# Patient Record
Sex: Male | Born: 1955 | Race: Black or African American | Hispanic: No | Marital: Single | Smoking: Current some day smoker
Health system: Southern US, Community
[De-identification: ages and names within clinical notes are randomized; demographics above are authoritative.]

## PROBLEM LIST (undated history)

## (undated) DIAGNOSIS — M549 Dorsalgia, unspecified: Secondary | ICD-10-CM

## (undated) DIAGNOSIS — I10 Essential (primary) hypertension: Secondary | ICD-10-CM

## (undated) DIAGNOSIS — J189 Pneumonia, unspecified organism: Secondary | ICD-10-CM

## (undated) DIAGNOSIS — I82409 Acute embolism and thrombosis of unspecified deep veins of unspecified lower extremity: Secondary | ICD-10-CM

## (undated) DIAGNOSIS — M25569 Pain in unspecified knee: Secondary | ICD-10-CM

## (undated) HISTORY — PX: MANDIBLE SURGERY: SHX707

---

## 1999-04-17 ENCOUNTER — Emergency Department (HOSPITAL_COMMUNITY): Admission: EM | Admit: 1999-04-17 | Discharge: 1999-04-17 | Payer: Self-pay | Admitting: Emergency Medicine

## 2001-03-15 ENCOUNTER — Emergency Department (HOSPITAL_COMMUNITY): Admission: EM | Admit: 2001-03-15 | Discharge: 2001-03-15 | Payer: Self-pay | Admitting: Emergency Medicine

## 2005-07-04 ENCOUNTER — Emergency Department (HOSPITAL_COMMUNITY): Admission: EM | Admit: 2005-07-04 | Discharge: 2005-07-04 | Payer: Self-pay | Admitting: Family Medicine

## 2006-06-29 ENCOUNTER — Ambulatory Visit: Payer: Self-pay | Admitting: Family Medicine

## 2006-06-30 ENCOUNTER — Ambulatory Visit (HOSPITAL_COMMUNITY): Admission: RE | Admit: 2006-06-30 | Discharge: 2006-06-30 | Payer: Self-pay | Admitting: Family Medicine

## 2006-07-01 ENCOUNTER — Ambulatory Visit: Payer: Self-pay | Admitting: *Deleted

## 2006-07-10 ENCOUNTER — Ambulatory Visit: Payer: Self-pay | Admitting: Family Medicine

## 2007-05-14 ENCOUNTER — Ambulatory Visit: Payer: Self-pay | Admitting: Internal Medicine

## 2007-05-14 LAB — CONVERTED CEMR LAB
ALT: 18 U/L
AST: 19 U/L
Albumin: 4.4 g/dL
Alkaline Phosphatase: 96 U/L
BUN: 17 mg/dL
Basophils Absolute: 0.1 K/uL
Basophils Relative: 1 %
CO2: 25 meq/L
Calcium: 9.5 mg/dL
Chloride: 107 meq/L
Cholesterol: 180 mg/dL
Creatinine, Ser: 1.66 mg/dL — ABNORMAL HIGH
Eosinophils Absolute: 0.2 K/uL
Eosinophils Relative: 4 %
Glucose, Bld: 81 mg/dL
HCT: 49.6 %
HDL: 51 mg/dL
Hemoglobin: 16.1 g/dL
LDL Cholesterol: 79 mg/dL
Lymphocytes Relative: 36 %
Lymphs Abs: 2.1 K/uL
MCHC: 32.5 g/dL
MCV: 84.1 fL
Monocytes Absolute: 0.6 K/uL
Monocytes Relative: 10 %
Neutro Abs: 2.9 K/uL
Neutrophils Relative %: 49 %
Platelets: 210 K/uL
Potassium: 4.2 meq/L
RBC: 5.9 M/uL — ABNORMAL HIGH
RDW: 13.9 %
Sodium: 144 meq/L
Total Bilirubin: 0.4 mg/dL
Total CHOL/HDL Ratio: 3.5
Total Protein: 7 g/dL
Triglycerides: 250 mg/dL — ABNORMAL HIGH
VLDL: 50 mg/dL — ABNORMAL HIGH
WBC: 5.9 10*3/microliter

## 2007-06-02 ENCOUNTER — Encounter (INDEPENDENT_AMBULATORY_CARE_PROVIDER_SITE_OTHER): Payer: Self-pay | Admitting: *Deleted

## 2007-08-25 ENCOUNTER — Ambulatory Visit: Payer: Self-pay | Admitting: Internal Medicine

## 2007-09-07 ENCOUNTER — Ambulatory Visit: Payer: Self-pay | Admitting: Internal Medicine

## 2007-09-07 ENCOUNTER — Encounter (INDEPENDENT_AMBULATORY_CARE_PROVIDER_SITE_OTHER): Payer: Self-pay | Admitting: Nurse Practitioner

## 2007-09-07 LAB — CONVERTED CEMR LAB
ALT: 14 U/L
AST: 17 U/L
Albumin: 4.3 g/dL
Alkaline Phosphatase: 89 U/L
BUN: 12 mg/dL
Basophils Absolute: 0.1 K/uL
Basophils Relative: 1 %
CO2: 25 meq/L
Calcium: 9.2 mg/dL
Chloride: 105 meq/L
Cholesterol: 183 mg/dL
Creatinine, Ser: 1.48 mg/dL
Eosinophils Absolute: 0.2 K/uL
Eosinophils Relative: 5 %
Glucose, Bld: 82 mg/dL
HCT: 50.4 %
HDL: 56 mg/dL
Hemoglobin: 15.7 g/dL
LDL Cholesterol: 86 mg/dL
Lymphocytes Relative: 19 %
Lymphs Abs: 0.9 K/uL
MCHC: 31.2 g/dL
MCV: 86.7 fL
Monocytes Absolute: 0.6 K/uL
Monocytes Relative: 12 %
Neutro Abs: 3.2 K/uL
Neutrophils Relative %: 63 %
Platelets: 185 K/uL
Potassium: 4.4 meq/L
RBC: 5.81 M/uL
RDW: 14.2 %
Sodium: 142 meq/L
Total Bilirubin: 0.7 mg/dL
Total CHOL/HDL Ratio: 3.3
Total Protein: 7 g/dL
Triglycerides: 203 mg/dL — ABNORMAL HIGH
VLDL: 41 mg/dL — ABNORMAL HIGH
WBC: 5 10*3/microliter

## 2007-10-06 ENCOUNTER — Ambulatory Visit: Payer: Self-pay | Admitting: Internal Medicine

## 2008-09-13 ENCOUNTER — Ambulatory Visit: Payer: Self-pay | Admitting: Internal Medicine

## 2008-10-12 ENCOUNTER — Ambulatory Visit: Payer: Self-pay | Admitting: Internal Medicine

## 2008-10-12 LAB — CONVERTED CEMR LAB
Calcium: 9.3 mg/dL (ref 8.4–10.5)
Cholesterol: 188 mg/dL (ref 0–200)
HDL: 50 mg/dL (ref >39)
Sodium: 142 meq/L (ref 135–145)
Total CHOL/HDL Ratio: 3.8

## 2008-10-18 ENCOUNTER — Ambulatory Visit: Payer: Self-pay | Admitting: Internal Medicine

## 2008-11-02 ENCOUNTER — Encounter: Admission: RE | Admit: 2008-11-02 | Discharge: 2008-11-20 | Payer: Self-pay | Admitting: Internal Medicine

## 2008-11-08 ENCOUNTER — Ambulatory Visit: Payer: Self-pay | Admitting: Internal Medicine

## 2008-11-08 LAB — CONVERTED CEMR LAB
BUN: 24 mg/dL — ABNORMAL HIGH (ref 6–23)
Calcium: 9.6 mg/dL (ref 8.4–10.5)
Glucose, Bld: 71 mg/dL (ref 70–99)
Potassium: 4 meq/L (ref 3.5–5.3)

## 2008-12-07 ENCOUNTER — Ambulatory Visit (HOSPITAL_COMMUNITY): Admission: RE | Admit: 2008-12-07 | Discharge: 2008-12-07 | Payer: Self-pay | Admitting: Internal Medicine

## 2009-03-21 ENCOUNTER — Ambulatory Visit: Payer: Self-pay | Admitting: Internal Medicine

## 2009-05-22 ENCOUNTER — Ambulatory Visit: Payer: Self-pay | Admitting: Internal Medicine

## 2009-06-01 ENCOUNTER — Encounter: Admission: RE | Admit: 2009-06-01 | Discharge: 2009-06-01 | Payer: Self-pay

## 2010-10-06 ENCOUNTER — Encounter: Payer: Self-pay | Admitting: Internal Medicine

## 2011-06-08 ENCOUNTER — Emergency Department (HOSPITAL_COMMUNITY)
Admission: EM | Admit: 2011-06-08 | Discharge: 2011-06-08 | Disposition: A | Discharge status: Home / Self Care | Payer: Self-pay | Attending: Emergency Medicine | Admitting: Emergency Medicine

## 2011-06-08 ENCOUNTER — Emergency Department (HOSPITAL_COMMUNITY): Payer: Self-pay

## 2011-06-08 DIAGNOSIS — M2550 Pain in unspecified joint: Secondary | ICD-10-CM | POA: Insufficient documentation

## 2011-06-08 DIAGNOSIS — IMO0002 Reserved for concepts with insufficient information to code with codable children: Secondary | ICD-10-CM | POA: Insufficient documentation

## 2011-06-08 DIAGNOSIS — M1710 Unilateral primary osteoarthritis, unspecified knee: Secondary | ICD-10-CM | POA: Insufficient documentation

## 2011-06-08 DIAGNOSIS — I10 Essential (primary) hypertension: Secondary | ICD-10-CM | POA: Insufficient documentation

## 2011-06-08 DIAGNOSIS — M7989 Other specified soft tissue disorders: Secondary | ICD-10-CM | POA: Insufficient documentation

## 2012-02-20 ENCOUNTER — Encounter (HOSPITAL_COMMUNITY): Payer: Self-pay

## 2012-02-20 ENCOUNTER — Emergency Department (HOSPITAL_COMMUNITY)
Admission: EM | Admit: 2012-02-20 | Discharge: 2012-02-20 | Disposition: A | Discharge status: Home / Self Care | Payer: Medicaid Other | Attending: Emergency Medicine | Admitting: Emergency Medicine

## 2012-02-20 DIAGNOSIS — Y92009 Unspecified place in unspecified non-institutional (private) residence as the place of occurrence of the external cause: Secondary | ICD-10-CM | POA: Insufficient documentation

## 2012-02-20 DIAGNOSIS — M549 Dorsalgia, unspecified: Secondary | ICD-10-CM | POA: Insufficient documentation

## 2012-02-20 DIAGNOSIS — T783XXA Angioneurotic edema, initial encounter: Secondary | ICD-10-CM | POA: Insufficient documentation

## 2012-02-20 DIAGNOSIS — F17200 Nicotine dependence, unspecified, uncomplicated: Secondary | ICD-10-CM | POA: Insufficient documentation

## 2012-02-20 DIAGNOSIS — T46905A Adverse effect of unspecified agents primarily affecting the cardiovascular system, initial encounter: Secondary | ICD-10-CM | POA: Insufficient documentation

## 2012-02-20 DIAGNOSIS — I10 Essential (primary) hypertension: Secondary | ICD-10-CM | POA: Insufficient documentation

## 2012-02-20 HISTORY — DX: Dorsalgia, unspecified: M54.9

## 2012-02-20 HISTORY — DX: Essential (primary) hypertension: I10

## 2012-02-20 LAB — CBC
MCV: 82.7 fL (ref 78.0–100.0)
Platelets: 231 10*3/uL (ref 150–400)
RDW: 13.6 % (ref 11.5–15.5)
WBC: 6.3 10*3/uL (ref 4.0–10.5)

## 2012-02-20 LAB — BASIC METABOLIC PANEL
CO2: 25 mEq/L (ref 19–32)
Calcium: 9.4 mg/dL (ref 8.4–10.5)
GFR calc Af Amer: 50 mL/min — ABNORMAL LOW (ref >90)
GFR calc non Af Amer: 43 mL/min — ABNORMAL LOW (ref >90)
Sodium: 136 mEq/L (ref 135–145)

## 2012-02-20 LAB — DIFFERENTIAL
Basophils Absolute: 0.1 10*3/uL (ref 0.0–0.1)
Eosinophils Relative: 4 % (ref 0–5)
Lymphocytes Relative: 35 % (ref 12–46)

## 2012-02-20 MED ORDER — FAMOTIDINE 20 MG PO TABS: 20.0000 mg | ORAL_TABLET | Freq: Two times a day (BID) | ORAL | Status: DC

## 2012-02-20 MED ORDER — DIPHENHYDRAMINE HCL 25 MG PO CAPS: 50.0000 mg | ORAL_CAPSULE | Freq: Four times a day (QID) | ORAL | Status: DC | PRN

## 2012-02-20 MED ORDER — SODIUM CHLORIDE 0.9 % IV BOLUS (SEPSIS)
1000.0000 mL | Freq: Once | INTRAVENOUS | Status: AC
  Administered 2012-02-20: 1000 mL via INTRAVENOUS

## 2012-02-20 MED ORDER — DIPHENHYDRAMINE HCL 50 MG/ML IJ SOLN
50.0000 mg | Freq: Once | INTRAMUSCULAR | Status: AC
  Administered 2012-02-20: 50 mg via INTRAVENOUS
  Filled 2012-02-20: qty 1

## 2012-02-20 MED ORDER — PREDNISONE 20 MG PO TABS: 60.0000 mg | ORAL_TABLET | Freq: Every day | ORAL | Status: AC

## 2012-02-20 MED ORDER — METHYLPREDNISOLONE SODIUM SUCC 125 MG IJ SOLR
125.0000 mg | Freq: Once | INTRAMUSCULAR | Status: AC
  Administered 2012-02-20: 125 mg via INTRAVENOUS
  Filled 2012-02-20: qty 2

## 2012-02-20 MED ORDER — FAMOTIDINE IN NACL 20-0.9 MG/50ML-% IV SOLN
20.0000 mg | Freq: Once | INTRAVENOUS | Status: AC
  Administered 2012-02-20: 20 mg via INTRAVENOUS
  Filled 2012-02-20: qty 50

## 2012-02-20 NOTE — Discharge Instructions (Signed)
Angioedema Angioedema (AE) is a sudden swelling of the eyelids, lips, lobes of ears, external genitalia, skin, and other parts of the body. AE can happen by itself. It usually begins during the night and is found on awakening. It can happen with hives and other allergic reactions. Attacks can be mild and annoying, or life-threatening if the air passages swell. AE generally occurs in a short time period (over minutes to hours) and gets better in 24 to 48 hours. It usually does not cause any serious problems.  There are 2 different kinds of AE:   Allergic AE.   Nonallergic AE.   There may be an overreaction or direct stimulation of cells that are a part of the immune system (mast cells).   There may be problems with the release of chemicals made by the body that cause swelling and inflammation (kinins). AE due to kinins can be inherited from parents (hereditary), or it can develop on its own (acquired). Acquired AE either shows up before, or along with, certain diseases or is due to the body's immune system attacking parts of the body's own cells (autoimmune).  CAUSES  Allergic  AE due to allergic reactions are caused by something that causes the body to react (trigger). Common triggers include:   Foods.   Medicines.   Latex.   Direct contact with certain fruits, vegetables, or animal saliva.   Insect stings.  Nonallergic  Mast cell stimulation may be caused by:   Medicines.   Dyes used in X-rays.   The body's own immune system reactions to parts of the body (autoimmune disease).   Possibly, some virus infections.   AE due to problems with kinins can be hereditary or acquired. Attacks are triggered by:   Mild injury.   Dental work or any surgery.   Stress.   Sudden changes in temperature.   Exercise.   Medicines.   AE due to problems with kinins can also be due to certain medicines, especially blood pressure medicines like angiotensin-converting enzyme (ACE)  inhibitors. African Americans are at nearly 5 times greater risk of developing AE than Caucasians from ACE inhibitors.  SYMPTOMS  Allergic symptoms:  Non-itchy swelling of the skin. Often the swelling is on the face and lips, but any area of the skin can swell. Sometimes, the swelling can be painful. If hives are present, there is intense itching.   Breathing problems if the air passages swell.  Nonallergic symptoms:  If internal organs are involved, there may be:   Nausea.   Abdominal pain.   Vomiting.   Difficulty swallowing.   Difficulty passing urine.   Breathing problems if the air passages swell.  Depending on the cause of AE, episodes may:  Only happen once (if triggers are removed or avoided).   Come back in unpredictable patterns.   Repeat for several years and then gradually fade away.  DIAGNOSIS  AE is diagnosed by:   Asking questions to find out how fast the symptoms began.   Taking a family history.   Physical exam.   Diagnostic tests. Tests could include:   Allergy skin tests to see if the problem is allergic.   Blood tests to diagnose hereditary and some acquired types of AE.   Other tests to see if there is a hidden disease leading to the AE.  TREATMENT  Treatment depends on the type and cause (if any) of the AE. Allergic  Allergic types of AE are treated with:   Immediate removal of   the trigger or medicine (if any).   Epinephrine injection.   Steroids.   Antihistamines.   Hospitalization for severe attacks.  Nonallergic  Mast cell stimulation types of AE are treated with:   Immediate removal of the trigger or medicine (if any).   Epinephrine injection.   Steroids.   Antihistamines.   Hospitalization for severe attacks.   Hereditary AE is treated with:   Medicines to prevent and treat attacks. There is little response to antihistamines, epinephrine, or steroids.   Preventive medicines before dental work or surgery.    Removing or avoiding medicines that trigger attacks.   Hospitalization for severe attacks.   Acquired AE is treated with:   Treating underlying disease (if any).   Medicines to prevent and treat attacks.  HOME CARE INSTRUCTIONS   Always carry your emergency allergy treatment medicines with you.   Wear a medical bracelet.   Avoid known triggers.  SEEK MEDICAL CARE IF:   You get repeat attacks.   Your attacks are more frequent or more severe despite preventive measures.   You have hereditary AE and are considering having children. It is important to discuss the risks of passing this on to your children.  SEEK IMMEDIATE MEDICAL CARE IF:   You have difficulty breathing.   You have difficulty swallowing.   You experience fainting.  This condition should be treated immediately. It can be life-threatening if it involves throat swelling. Document Released: 11/10/2001 Document Revised: 08/21/2011 Document Reviewed: 08/31/2008 [facility] Patient Information 2012 [place], [place].  STOP TAKING LISINOPRIL. DO NOT TAKE ANY ADDITIONAL BLOOD PRESSURE MEDICATION UNTIL YOU SEE YOUR PRIMARY CARE PHYSICIAN

## 2012-02-20 NOTE — ED Provider Notes (Signed)
History     CSN: [id number]  Arrival date & time 02/20/12  1705   First MD Initiated Contact with Patient 02/20/12 1759      Chief Complaint  Patient presents with  . Facial Swelling  . Oral Swelling    (Consider location/radiation/quality/duration/timing/severity/associated sxs/prior treatment) Patient is a 56 y.o. male presenting with allergic reaction. The history is provided by the patient. No language interpreter was used.  Allergic Reaction The primary symptoms are  angioedema. The primary symptoms do not include wheezing, shortness of breath, cough, abdominal pain, nausea, vomiting, dizziness, palpitations, rash or urticaria. The current episode started 13 to 24 hours ago. The problem has been gradually worsening. This is a new problem.  The angioedema began 13 to 24 hours ago. The angioedema has been gradually worsening since its onset. It is a new problem. It is located on the lips and face. The angioedema is not associated with shortness of breath.  Associated with: from lisinopril. Significant symptoms that are not present include rhinorrhea.    Past Medical History  Diagnosis Date  . Hypertension   . Back pain     Past Surgical History  Procedure Date  . Mandible surgery     History reviewed. No pertinent family history.  History  Substance Use Topics  . Smoking status: Current Some Day Smoker  . Smokeless tobacco: Not on file  . Alcohol Use: Yes     "from time to time"      Review of Systems  Constitutional: Negative for fever, chills, activity change, appetite change and fatigue.  HENT: Positive for facial swelling. Negative for congestion, sore throat, rhinorrhea, neck pain and neck stiffness.   Respiratory: Negative for cough, shortness of breath and wheezing.   Cardiovascular: Negative for chest pain and palpitations.  Gastrointestinal: Negative for nausea, vomiting and abdominal pain.  Genitourinary: Negative for dysuria, urgency, frequency and  flank pain.  Musculoskeletal: Negative for myalgias, back pain and arthralgias.  Skin: Negative for rash.  Neurological: Negative for dizziness, weakness, light-headedness, numbness and headaches.  All other systems reviewed and are negative.    Allergies  Review of patient's allergies indicates no known allergies.  Home Medications   Current Outpatient Rx  Name Route Sig Dispense Refill  . ADULT MULTIVITAMIN W/MINERALS CH Oral Take 1 tablet by mouth daily.    Marland Kitchen PRESCRIPTION MEDICATION Oral Take 1 tablet by mouth daily. 1 tablet daily for blood pressure.    Marland Kitchen DIPHENHYDRAMINE HCL 25 MG PO CAPS Oral Take 2 capsules (50 mg total) by mouth every 6 (six) hours as needed for itching. 30 capsule 0  . FAMOTIDINE 20 MG PO TABS Oral Take 1 tablet (20 mg total) by mouth 2 (two) times daily. 30 tablet 0  . PREDNISONE 20 MG PO TABS Oral Take 3 tablets (60 mg total) by mouth daily. 15 tablet 0    BP 131/77  Pulse 66  Temp 98.4 F (36.9 C)  Resp 18  SpO2 100%  Physical Exam  Nursing note and vitals reviewed. Constitutional: He is oriented to person, place, and time. He appears well-developed and well-nourished. No distress.  HENT:  Head: Normocephalic and atraumatic.  Mouth/Throat: Oropharynx is clear and moist.       Significant facial swelling secondary to angioedema. There is no tongue or oral pharyngeal involvement. He has no shortness of breath on examination  Eyes: Conjunctivae and EOM are normal. Pupils are equal, round, and reactive to light.  Neck: Normal range of motion. Neck  supple.  Cardiovascular: Normal rate, regular rhythm, normal heart sounds and intact distal pulses.  Exam reveals no gallop and no friction rub.   No murmur heard. Pulmonary/Chest: Effort normal and breath sounds normal. No respiratory distress. He has no wheezes. He exhibits no tenderness.  Abdominal: Soft. There is no tenderness. There is no rebound and no guarding.  Musculoskeletal: Normal range of  motion. He exhibits no edema and no tenderness.  Neurological: He is alert and oriented to person, place, and time. No cranial nerve deficit.  Skin: Skin is warm. No rash noted.    ED Course  Procedures (including critical care time)  Labs Reviewed  BASIC METABOLIC PANEL - Abnormal; Notable for the following:    BUN 24 (*)    Creatinine, Ser 1.71 (*)    GFR calc non Af Amer 43 (*)    GFR calc Af Amer 50 (*)    All other components within normal limits  CBC  DIFFERENTIAL   No results found.   1. Angioedema       MDM  Angioedema secondary to lisinopril. He was instructed to stop taking this immediately. Instructed to take no blood pressure medications until followed up with his primary care physician. He was monitored in the emergency department for approximately 6 hours with no worsening of symptoms. Received a dose of prednisone, Benadryl, Pepcid. He'll be discharged as he has no worsening of his symptoms. There is no respiratory involvement. Provided strict return precautions.        Dayton Bailiff, MD 02/20/12 [phone]

## 2012-02-20 NOTE — ED Notes (Addendum)
PT states his mouth started swelling around noon.  Pt took "BP pill 5mg , starts w/"L" this am around 445am.  Currently pt is able to speak.  Mouth is very swollen and states his throat is ok.   PT took benadryl 50mg  po around 4pm.

## 2012-06-07 ENCOUNTER — Encounter (HOSPITAL_COMMUNITY): Payer: Self-pay | Admitting: *Deleted

## 2012-06-07 ENCOUNTER — Encounter (HOSPITAL_COMMUNITY): Payer: Self-pay

## 2012-06-07 ENCOUNTER — Emergency Department (HOSPITAL_COMMUNITY)
Admission: EM | Admit: 2012-06-07 | Discharge: 2012-06-08 | Disposition: A | Discharge status: Home / Self Care | Payer: Medicaid Other | Attending: Emergency Medicine | Admitting: Emergency Medicine

## 2012-06-07 ENCOUNTER — Emergency Department (HOSPITAL_COMMUNITY)
Admission: EM | Admit: 2012-06-07 | Discharge: 2012-06-07 | Disposition: A | Discharge status: Home / Self Care | Payer: Medicaid Other | Attending: Emergency Medicine | Admitting: Emergency Medicine

## 2012-06-07 DIAGNOSIS — M545 Low back pain, unspecified: Secondary | ICD-10-CM | POA: Insufficient documentation

## 2012-06-07 DIAGNOSIS — F17200 Nicotine dependence, unspecified, uncomplicated: Secondary | ICD-10-CM | POA: Insufficient documentation

## 2012-06-07 DIAGNOSIS — Z86718 Personal history of other venous thrombosis and embolism: Secondary | ICD-10-CM | POA: Insufficient documentation

## 2012-06-07 DIAGNOSIS — I10 Essential (primary) hypertension: Secondary | ICD-10-CM | POA: Insufficient documentation

## 2012-06-07 DIAGNOSIS — G8929 Other chronic pain: Secondary | ICD-10-CM | POA: Insufficient documentation

## 2012-06-07 DIAGNOSIS — M549 Dorsalgia, unspecified: Secondary | ICD-10-CM | POA: Insufficient documentation

## 2012-06-07 DIAGNOSIS — N390 Urinary tract infection, site not specified: Secondary | ICD-10-CM

## 2012-06-07 HISTORY — DX: Acute embolism and thrombosis of unspecified deep veins of unspecified lower extremity: I82.409

## 2012-06-07 LAB — BASIC METABOLIC PANEL
Chloride: 104 mEq/L (ref 96–112)
GFR calc Af Amer: 66 mL/min — ABNORMAL LOW (ref >90)
GFR calc non Af Amer: 57 mL/min — ABNORMAL LOW (ref >90)
Potassium: 4.1 mEq/L (ref 3.5–5.1)
Sodium: 138 mEq/L (ref 135–145)

## 2012-06-07 LAB — CBC
HCT: 45.1 % (ref 39.0–52.0)
Hemoglobin: 14.6 g/dL (ref 13.0–17.0)
MCH: 26.8 pg (ref 26.0–34.0)
MCHC: 32.4 g/dL (ref 30.0–36.0)
MCV: 82.9 fL (ref 78.0–100.0)
Platelets: 246 K/uL (ref 150–400)
RBC: 5.44 MIL/uL (ref 4.22–5.81)
RDW: 13.4 % (ref 11.5–15.5)
WBC: 6.7 K/uL (ref 4.0–10.5)

## 2012-06-07 LAB — URINALYSIS, ROUTINE W REFLEX MICROSCOPIC
Glucose, UA: NEGATIVE mg/dL
Leukocytes, UA: NEGATIVE
Nitrite: NEGATIVE
Protein, ur: NEGATIVE mg/dL
Urobilinogen, UA: 0.2 mg/dL (ref 0.0–1.0)

## 2012-06-07 MED ORDER — METHOCARBAMOL 500 MG PO TABS: 500.0000 mg | ORAL_TABLET | Freq: Two times a day (BID) | ORAL | Status: DC

## 2012-06-07 MED ORDER — HYDROMORPHONE HCL PF 1 MG/ML IJ SOLN
1.0000 mg | Freq: Once | INTRAMUSCULAR | Status: AC
  Administered 2012-06-07: 1 mg via INTRAMUSCULAR
  Filled 2012-06-07: qty 1

## 2012-06-07 MED ORDER — OXYCODONE-ACETAMINOPHEN 5-325 MG PO TABS
ORAL_TABLET | ORAL | Status: AC
  Filled 2012-06-07: qty 1

## 2012-06-07 MED ORDER — OXYCODONE-ACETAMINOPHEN 5-325 MG PO TABS
1.0000 | ORAL_TABLET | Freq: Once | ORAL | Status: AC
  Administered 2012-06-07: 1 via ORAL

## 2012-06-07 MED ORDER — ONDANSETRON 8 MG PO TBDP
8.0000 mg | ORAL_TABLET | Freq: Once | ORAL | Status: AC
  Administered 2012-06-07: 8 mg via ORAL
  Filled 2012-06-07: qty 1

## 2012-06-07 MED ORDER — OXYCODONE-ACETAMINOPHEN 5-325 MG PO TABS
2.0000 | ORAL_TABLET | Freq: Once | ORAL | Status: AC
  Administered 2012-06-07: 2 via ORAL
  Filled 2012-06-07: qty 2

## 2012-06-07 MED ORDER — DIAZEPAM 5 MG/ML IJ SOLN
10.0000 mg | Freq: Once | INTRAMUSCULAR | Status: AC
  Administered 2012-06-07: 10 mg via INTRAMUSCULAR
  Filled 2012-06-07: qty 2

## 2012-06-07 NOTE — ED Provider Notes (Signed)
History     CSN: [id number]  Arrival date & time 06/07/12  4098   First MD Initiated Contact with Patient 06/07/12 [phone]      Chief Complaint  Patient presents with  . Leg Pain    (Consider location/radiation/quality/duration/timing/severity/associated sxs/prior treatment) HPI Comments: Patient presents with acute on chronic back pain that started about 3 days ago. He reports gradual onset and progressive worsening. The pain is sharp and starts in his left flank and radiates down his left leg to his foot. He reports a history of chronic back pain from a work accident from the 1980's but this pain is more sever than his typical pain. He has tried ibuprofen and aspirin for the pain which does not help. He denies fever, SOB, NVD, chest pain, numbness/tingling of extremities.   Patient is a 56 y.o. male presenting with leg pain.  Leg Pain     Past Medical History  Diagnosis Date  . Hypertension   . Back pain     Past Surgical History  Procedure Date  . Mandible surgery     History reviewed. No pertinent family history.  History  Substance Use Topics  . Smoking status: Current Some Day Smoker  . Smokeless tobacco: Not on file  . Alcohol Use: Yes     "from time to time"      Review of Systems  Genitourinary: Positive for flank pain.  Musculoskeletal: Positive for back pain.  All other systems reviewed and are negative.    Allergies  Review of patient's allergies indicates no known allergies.  Home Medications   Current Outpatient Rx  Name Route Sig Dispense Refill  . DIPHENHYDRAMINE HCL 25 MG PO CAPS Oral Take 2 capsules (50 mg total) by mouth every 6 (six) hours as needed for itching. 30 capsule 0  . FAMOTIDINE 20 MG PO TABS Oral Take 1 tablet (20 mg total) by mouth 2 (two) times daily. 30 tablet 0  . ADULT MULTIVITAMIN W/MINERALS CH Oral Take 1 tablet by mouth daily.    Marland Kitchen PRESCRIPTION MEDICATION Oral Take 1 tablet by mouth daily. 1 tablet daily for blood  pressure.      BP 127/91  Pulse 55  Temp 97.5 F (36.4 C) (Oral)  Resp 16  SpO2 100%  Physical Exam  Nursing note and vitals reviewed. Constitutional: He is oriented to person, place, and time. He appears well-developed and well-nourished. No distress.  HENT:  Head: Normocephalic and atraumatic.  Eyes: Conjunctivae normal and EOM are normal. No scleral icterus.  Neck: Normal range of motion. Neck supple.  Cardiovascular: Normal rate, regular rhythm and intact distal pulses.  Exam reveals no gallop and no friction rub.   No murmur heard. Pulmonary/Chest: Effort normal and breath sounds normal. No respiratory distress. He has no wheezes. He has no rales. He exhibits no tenderness.  Abdominal: Soft. He exhibits no mass. There is no tenderness. There is no rebound and no guarding.  Genitourinary:       Left CVA tenderness noted.   Musculoskeletal: He exhibits no edema.       Straight leg raise positive on left side. Midline tenderness noted on palpation of spine. No step off noted. Tenderness to palpation laterally of spine on the left side.   Neurological: He is alert and oriented to person, place, and time. No cranial nerve deficit.       Patient limps to the bed due to pain. Strength limited in left leg due to pain. Sensation diminish in  left lower extremity.   Skin: Skin is warm and dry. He is not diaphoretic.  Psychiatric: He has a normal mood and affect. His behavior is normal.    ED Course  Procedures (including critical care time)   Labs Reviewed  URINALYSIS, ROUTINE W REFLEX MICROSCOPIC   No results found.   No diagnosis found.    MDM  7:46 AM I ordered Percocet for patient's pain, most likely chronic back pain. Will check a urine to evaluate for hematuria for potential kidney stones.   11:32 AM Urinalysis is unremarkable. Patient can be discharged with Robaxin and a follow up with a medical provider from the resource guide for outpatient management of his pain.  No further evaluation needed at this time.       Emilia Beck, PA-C 06/07/12 1133

## 2012-06-07 NOTE — ED Notes (Signed)
Pt c/o left flank pain radiates to the front and down to his left leg, wife insist it is the kidney, pt has frequent urination lately, urine appeared to be dark, cloudy and odorous. Pain 10/10

## 2012-06-07 NOTE — ED Provider Notes (Signed)
History     CSN: [id number]  Arrival date & time 06/07/12  1744   First MD Initiated Contact with Patient 06/07/12 2227      Chief Complaint  Patient presents with  . Back Pain   HPI  History provided by the patient and recent medical chart. Patient is a 56 year old male with history of hypertension and back pains. Patient presents with complaints of chronic back pain is worse today. Patient states he initially had a work-related injury in the early 90s where he was smashed between machinery. Since that time he reports having similar issues of low back pain that radiates to left leg. Patient denies any new recent injury or trauma. Pain waxes and wanes but seems worse over the past few days. Patient was seen earlier today at [facility] emergency room and told he had "muscle spasms". Patient states she was given prescriptions for medications but he has not filled the prescription yet. Patient is not have PCP. Patient did use some ibuprofen without much change of symptoms. Pain is worse with movements and walking. Patient also mentions having some dark urine. Denies any dysuria or urinary frequency. Denies any nausea vomiting. Denies previous history of kidney stones. He denies any urinary or fecal incontinence, urinary retention or perineal numbness.    Past Medical History  Diagnosis Date  . Hypertension   . Back pain   . DVT (deep venous thrombosis)     left leg    Past Surgical History  Procedure Date  . Mandible surgery     No family history on file.  History  Substance Use Topics  . Smoking status: Current Some Day Smoker  . Smokeless tobacco: Not on file  . Alcohol Use: Yes     "from time to time"      Review of Systems  Constitutional: Negative for fever and chills.  HENT: Negative for neck pain.   Gastrointestinal: Negative for nausea, vomiting and abdominal pain.  Genitourinary: Negative for dysuria, frequency and hematuria.  Musculoskeletal: Positive for back  pain.  Neurological: Negative for weakness.    Allergies  Lisinopril  Home Medications   Current Outpatient Rx  Name Route Sig Dispense Refill  . ALBUTEROL SULFATE HFA 108 (90 BASE) MCG/ACT IN AERS Inhalation Inhale 2 puffs into the lungs every 6 (six) hours as needed. For shortness of breath    . IBUPROFEN 200 MG PO TABS Oral Take 200 mg by mouth 2 (two) times daily. Was told today (9/23) not take this medication anymore    . ADULT MULTIVITAMIN W/MINERALS CH Oral Take 1 tablet by mouth daily.      BP 131/78  Pulse 63  Temp 97.9 F (36.6 C) (Oral)  Resp 16  SpO2 98%  Physical Exam  Nursing note and vitals reviewed. Constitutional: He is oriented to person, place, and time. He appears well-developed and well-nourished. No distress.  HENT:  Head: Normocephalic.  Cardiovascular: Normal rate and regular rhythm.   Murmur heard. Pulmonary/Chest: Effort normal and breath sounds normal. No respiratory distress. He has no wheezes. He has no rales.  Abdominal: Soft. There is no tenderness. There is no rebound and no guarding.       No CVA tenderness  Musculoskeletal: Normal range of motion. He exhibits no edema.       Lumbar back: He exhibits tenderness.       Back:  Neurological: He is alert and oriented to person, place, and time. He has normal strength. No sensory deficit.  Skin: Skin is warm. No rash noted. No erythema.  Psychiatric: He has a normal mood and affect. His behavior is normal.    ED Course  Procedures   Results for orders placed during the hospital encounter of 06/07/12  CBC      Component Value Range   WBC 6.7  4.0 - 10.5 K/uL   RBC 5.44  4.22 - 5.81 MIL/uL   Hemoglobin 14.6  13.0 - 17.0 g/dL   HCT 81.1  91.4 - 78.2 %   MCV 82.9  78.0 - 100.0 fL   MCH 26.8  26.0 - 34.0 pg   MCHC 32.4  30.0 - 36.0 g/dL   RDW 95.6  21.3 - 08.6 %   Platelets 246  150 - 400 K/uL  BASIC METABOLIC PANEL      Component Value Range   Sodium 138  135 - 145 mEq/L   Potassium  4.1  3.5 - 5.1 mEq/L   Chloride 104  96 - 112 mEq/L   CO2 26  19 - 32 mEq/L   Glucose, Bld 87  70 - 99 mg/dL   BUN 17  6 - 23 mg/dL   Creatinine, Ser 5.78  0.50 - 1.35 mg/dL   Calcium 8.9  8.4 - 46.9 mg/dL   GFR calc non Af Amer 57 (*) >90 mL/min   GFR calc Af Amer 66 (*) >90 mL/min  URINALYSIS, ROUTINE W REFLEX MICROSCOPIC      Component Value Range   Color, Urine YELLOW  YELLOW   APPearance CLEAR  CLEAR   Specific Gravity, Urine 1.031 (*) 1.005 - 1.030   pH 5.5  5.0 - 8.0   Glucose, UA NEGATIVE  NEGATIVE mg/dL   Hgb urine dipstick NEGATIVE  NEGATIVE   Bilirubin Urine NEGATIVE  NEGATIVE   Ketones, ur NEGATIVE  NEGATIVE mg/dL   Protein, ur 30 (*) NEGATIVE mg/dL   Urobilinogen, UA 1.0  0.0 - 1.0 mg/dL   Nitrite NEGATIVE  NEGATIVE   Leukocytes, UA SMALL (*) NEGATIVE  URINE MICROSCOPIC-ADD ON      Component Value Range   Squamous Epithelial / LPF FEW (*) RARE   WBC, UA 7-10  <3 WBC/hpf      1. Back pain   2. UTI (lower urinary tract infection)       MDM  10:35 PM patient seen and evaluated. Patient lying comfortably in bed does not appear in any acute distress.   Patient given pain medications and muscle relaxer. He reports improvements. Urine still pending.   Urine with 7-10 WBCs. Patient and tests discussed with attending physician. Patient has no PCP followup. Will send urine for culture and plan to treat for possible UTI symptoms. No hematuria present. At this time doubt kidney stone. Patient advised of findings and treatment plan.     Angus Seller, [place] 06/08/12 [phone]

## 2012-06-07 NOTE — ED Notes (Signed)
Given pt a urinal in order to give a urine specimen.

## 2012-06-07 NOTE — ED Notes (Signed)
Pt informed a urine specimen was needed also. Given water to sip on. Pt was unable to provide a specimen earlier when he went to the restroom.

## 2012-06-07 NOTE — ED Notes (Signed)
To ED for eval of left leg pain that radiates up to back. Pt states he has had this same pain for years and has been seen several times for same.

## 2012-06-08 LAB — URINALYSIS, ROUTINE W REFLEX MICROSCOPIC
Ketones, ur: NEGATIVE mg/dL
Nitrite: NEGATIVE
Specific Gravity, Urine: 1.031 — ABNORMAL HIGH (ref 1.005–1.030)
Urobilinogen, UA: 1 mg/dL (ref 0.0–1.0)
pH: 5.5 (ref 5.0–8.0)

## 2012-06-08 MED ORDER — CEFTRIAXONE SODIUM 1 G IJ SOLR
1.0000 g | Freq: Once | INTRAMUSCULAR | Status: AC
  Administered 2012-06-08: 1 g via INTRAMUSCULAR
  Filled 2012-06-08: qty 10

## 2012-06-08 MED ORDER — HYDROCODONE-ACETAMINOPHEN 5-325 MG PO TABS: 1.0000 | ORAL_TABLET | ORAL | Status: DC | PRN

## 2012-06-08 MED ORDER — CEPHALEXIN 500 MG PO CAPS: 500.0000 mg | ORAL_CAPSULE | Freq: Four times a day (QID) | ORAL | Status: DC

## 2012-06-08 NOTE — ED Provider Notes (Signed)
History/physical exam/procedure(s) were performed by non-physician practitioner and as supervising physician I was immediately available for consultation/collaboration. I have reviewed all notes and am in agreement with care and plan.   Hilario Quarry, MD 06/08/12 [phone]

## 2012-06-09 LAB — URINE CULTURE
Colony Count: NO GROWTH
Culture: NO GROWTH

## 2012-06-09 NOTE — ED Provider Notes (Signed)
Medical screening examination/treatment/procedure(s) were conducted as a shared visit with non-physician practitioner(s) and myself.  I personally evaluated the patient during the encounter  Pt well appearing, he admits pain is chronic, no focal motor deficits noted on my exam Stable for d/c  Joya Gaskins, MD 06/09/12 1404

## 2012-06-30 NOTE — Progress Notes (Signed)
Pt presented to [facility] ED triage requesting assistance with getting past rx from 9/23-24/13 [facility] CM faxed copy of rx for keflex and robaxin to [phone] per pt request--[facility]m [facility] through the [facility].  Explained to pt that he is not a candidate for [facility] indigent medication services and to obtain indigent services for medications he would have to visit the [place] clinic.  Pt is confirmed he was seen by [place] [facility] or [place] [facility] EPIC updated

## 2012-09-01 ENCOUNTER — Emergency Department (INDEPENDENT_AMBULATORY_CARE_PROVIDER_SITE_OTHER)
Admission: EM | Admit: 2012-09-01 | Discharge: 2012-09-01 | Disposition: A | Discharge status: Home / Self Care | Payer: No Typology Code available for payment source | Source: Home / Self Care | Attending: Family Medicine | Admitting: Family Medicine

## 2012-09-01 ENCOUNTER — Encounter (HOSPITAL_COMMUNITY): Payer: Self-pay

## 2012-09-01 DIAGNOSIS — M25569 Pain in unspecified knee: Secondary | ICD-10-CM

## 2012-09-01 DIAGNOSIS — M25561 Pain in right knee: Secondary | ICD-10-CM

## 2012-09-01 LAB — CBC
MCHC: 32.6 g/dL (ref 30.0–36.0)
MCV: 84.1 fL (ref 78.0–100.0)
Platelets: 188 10*3/uL (ref 150–400)
RDW: 13.6 % (ref 11.5–15.5)
WBC: 5.4 10*3/uL (ref 4.0–10.5)

## 2012-09-01 LAB — POCT I-STAT, CHEM 8
Calcium, Ion: 1.19 mmol/L (ref 1.12–1.23)
Chloride: 104 mEq/L (ref 96–112)
HCT: 51 % (ref 39.0–52.0)
Hemoglobin: 17.3 g/dL — ABNORMAL HIGH (ref 13.0–17.0)
TCO2: 28 mmol/L (ref 0–100)

## 2012-09-01 MED ORDER — CELECOXIB 100 MG PO CAPS: 100.0000 mg | ORAL_CAPSULE | Freq: Two times a day (BID) | ORAL | Status: DC

## 2012-09-01 MED ORDER — PREDNISONE 20 MG PO TABS: ORAL_TABLET | ORAL | Status: DC

## 2012-09-01 MED ORDER — TRAMADOL HCL 50 MG PO TABS: 50.0000 mg | ORAL_TABLET | Freq: Four times a day (QID) | ORAL | Status: DC | PRN

## 2012-09-01 NOTE — ED Notes (Signed)
C/o pain in  Right knee x2 weeks patient states has a history of gout has been using OTC medications with no relief

## 2012-09-01 NOTE — ED Provider Notes (Signed)
History     CSN: [id number]  Arrival date & time 09/01/12  1012   First MD Initiated Contact with Patient 09/01/12 1250      Chief Complaint  Patient presents with  . Gout    (Consider location/radiation/quality/duration/timing/severity/associated sxs/prior treatment) HPI Comments: 56 year old smoker male with history of gout and chronic pain among other comorbidities. Here complaining of right knee pain for 2 weeks. Also reports pain in bilateral first MP joints. He also has a history of foot corns. No current joint swelling or redness. Denies fever or chills. Taking over-the-counter medications with no relief. Patient does close to the [place] [facility] and has a followup appointment in January.    Past Medical History  Diagnosis Date  . Hypertension   . Back pain   . DVT (deep venous thrombosis)     left leg    Past Surgical History  Procedure Date  . Mandible surgery     No family history on file.  History  Substance Use Topics  . Smoking status: Current Some Day Smoker  . Smokeless tobacco: Not on file  . Alcohol Use: Yes     Comment: "from time to time"      Review of Systems  Constitutional: Negative for fever, chills and fatigue.  HENT: Negative for congestion and rhinorrhea.   Respiratory: Negative for cough and shortness of breath.   Cardiovascular: Negative for chest pain and leg swelling.  Gastrointestinal: Negative for nausea, vomiting and abdominal pain.  Musculoskeletal: Positive for arthralgias. Negative for joint swelling.  Skin: Negative for rash.  Neurological: Negative for dizziness and headaches.    Allergies  Lisinopril  Home Medications   Current Outpatient Rx  Name  Route  Sig  Dispense  Refill  . ALBUTEROL SULFATE HFA 108 (90 BASE) MCG/ACT IN AERS   Inhalation   Inhale 2 puffs into the lungs every 6 (six) hours as needed. For shortness of breath         . CELECOXIB 100 MG PO CAPS   Oral   Take 1 capsule (100 mg total) by mouth 2  (two) times daily.   20 capsule   0   . ADULT MULTIVITAMIN W/MINERALS CH   Oral   Take 1 tablet by mouth daily.         Marland Kitchen PREDNISONE 20 MG PO TABS      2 tabs by mouth daily for 5 days   10 tablet   0   . TRAMADOL HCL 50 MG PO TABS   Oral   Take 1 tablet (50 mg total) by mouth every 6 (six) hours as needed for pain.   20 tablet   0     BP 126/71  Pulse 66  Temp 97.3 F (36.3 C) (Oral)  Resp 21  SpO2 100%  Physical Exam  Nursing note and vitals reviewed. Constitutional: He is oriented to person, place, and time. He appears well-developed. No distress.       Disheveled.   HENT:  Head: Normocephalic and atraumatic.  Eyes: Pupils are equal, round, and reactive to light. No scleral icterus.       Bilateral conjunctival injection. No jaundice  Neck: No JVD present.  Cardiovascular: Normal heart sounds.   Pulmonary/Chest: Breath sounds normal. He has no wheezes.  Abdominal: Soft.       No hepatomegaly or ascites  Musculoskeletal:       Right knee: No obvious swelling or deformity. No redness or increased temperature. No palpable effusion.  Reported pain with palpation diffusely below to the patella. No hyper laxity on stress valgo or varus. Crepitus fell with flexion and extension and also patient reported pain with active and passive movement. Negative drawer test. Do not feel Baker's cyst. Right lower extremity with normal superficial sensation.  Patent dorsal pedal and tibial posterior pulses. Patient is weightbearing but  reports pain with walking and has a limp on the right side. There are tender bilateral hypertrophy of the foot first MP joints with mild hallux valgus. No fluctuation, erythema or increased temperature.    Lymphadenopathy:    He has no cervical adenopathy.  Neurological: He is alert and oriented to person, place, and time.  Skin: No rash noted. He is not diaphoretic.    ED Course  Procedures (including critical care time)   Labs Reviewed   URIC ACID  CBC   No results found.   1. Right knee pain       MDM  Impress degenerative joint disease. No significant findings consistent with acute effusion. Prescribed prednisone, and tramadol and Celebrex. Asked to followup at his primary care office in the [place] clinic. Creatinine is stable compare with prior result 2 months ago. Normal electrolytes and hemoglobin. CBC, uric acid were pending at the time of discharge. Supportive care and red flags that should prompt his return to medical attention discussed with patient and provided in writing.        Sharin Grave, MD 09/01/12 1344

## 2012-09-02 NOTE — ED Notes (Signed)
Uric Acid 4.1 WNL. Malik Mullins 09/02/2012

## 2012-10-27 ENCOUNTER — Emergency Department (HOSPITAL_COMMUNITY): Payer: Non-veteran care

## 2012-10-27 ENCOUNTER — Inpatient Hospital Stay (HOSPITAL_COMMUNITY)
Admission: EM | Admit: 2012-10-27 | Discharge: 2012-11-01 | DRG: 299 | Disposition: A | Discharge status: Home / Self Care | Payer: Non-veteran care | Attending: Internal Medicine | Admitting: Internal Medicine

## 2012-10-27 ENCOUNTER — Encounter (HOSPITAL_COMMUNITY): Payer: Self-pay | Admitting: Emergency Medicine

## 2012-10-27 DIAGNOSIS — M7989 Other specified soft tissue disorders: Secondary | ICD-10-CM

## 2012-10-27 DIAGNOSIS — M1710 Unilateral primary osteoarthritis, unspecified knee: Secondary | ICD-10-CM | POA: Diagnosis present

## 2012-10-27 DIAGNOSIS — I82409 Acute embolism and thrombosis of unspecified deep veins of unspecified lower extremity: Principal | ICD-10-CM | POA: Diagnosis present

## 2012-10-27 DIAGNOSIS — I82403 Acute embolism and thrombosis of unspecified deep veins of lower extremity, bilateral: Secondary | ICD-10-CM

## 2012-10-27 DIAGNOSIS — M1711 Unilateral primary osteoarthritis, right knee: Secondary | ICD-10-CM | POA: Diagnosis present

## 2012-10-27 DIAGNOSIS — Z79899 Other long term (current) drug therapy: Secondary | ICD-10-CM

## 2012-10-27 DIAGNOSIS — M170 Bilateral primary osteoarthritis of knee: Secondary | ICD-10-CM

## 2012-10-27 DIAGNOSIS — J189 Pneumonia, unspecified organism: Secondary | ICD-10-CM | POA: Diagnosis present

## 2012-10-27 DIAGNOSIS — F17200 Nicotine dependence, unspecified, uncomplicated: Secondary | ICD-10-CM | POA: Diagnosis present

## 2012-10-27 DIAGNOSIS — I10 Essential (primary) hypertension: Secondary | ICD-10-CM | POA: Diagnosis present

## 2012-10-27 DIAGNOSIS — Z86718 Personal history of other venous thrombosis and embolism: Secondary | ICD-10-CM | POA: Diagnosis present

## 2012-10-27 DIAGNOSIS — M79609 Pain in unspecified limb: Secondary | ICD-10-CM

## 2012-10-27 HISTORY — DX: Pain in unspecified knee: M25.569

## 2012-10-27 LAB — CBC WITH DIFFERENTIAL/PLATELET
Basophils Absolute: 0.1 10*3/uL (ref 0.0–0.1)
Basophils Relative: 1 % (ref 0–1)
HCT: 46.9 % (ref 39.0–52.0)
Lymphocytes Relative: 29 % (ref 12–46)
Neutro Abs: 4.3 10*3/uL (ref 1.7–7.7)
Neutrophils Relative %: 58 % (ref 43–77)
Platelets: 268 10*3/uL (ref 150–400)
RDW: 13.5 % (ref 11.5–15.5)
WBC: 7.3 10*3/uL (ref 4.0–10.5)

## 2012-10-27 LAB — PROTIME-INR
INR: 0.89 (ref 0.00–1.49)
Prothrombin Time: 12 seconds (ref 11.6–15.2)

## 2012-10-27 LAB — BASIC METABOLIC PANEL
CO2: 28 mEq/L (ref 19–32)
Chloride: 101 mEq/L (ref 96–112)
GFR calc Af Amer: 71 mL/min — ABNORMAL LOW (ref >90)
Potassium: 4.4 mEq/L (ref 3.5–5.1)
Sodium: 137 mEq/L (ref 135–145)

## 2012-10-27 LAB — HEPARIN LEVEL (UNFRACTIONATED): Heparin Unfractionated: 0.51 IU/mL (ref 0.30–0.70)

## 2012-10-27 MED ORDER — ACETAMINOPHEN 650 MG RE SUPP: 650.0000 mg | Freq: Four times a day (QID) | RECTAL | Status: DC | PRN

## 2012-10-27 MED ORDER — HEPARIN BOLUS VIA INFUSION
5000.0000 [IU] | Freq: Once | INTRAVENOUS | Status: AC
  Administered 2012-10-27: 5000 [IU] via INTRAVENOUS

## 2012-10-27 MED ORDER — TETRAHYDROZOLINE HCL 0.05 % OP SOLN
2.0000 [drp] | Freq: Four times a day (QID) | OPHTHALMIC | Status: DC | PRN
  Filled 2012-10-27: qty 15

## 2012-10-27 MED ORDER — ALBUTEROL SULFATE HFA 108 (90 BASE) MCG/ACT IN AERS: 2.0000 | INHALATION_SPRAY | Freq: Four times a day (QID) | RESPIRATORY_TRACT | Status: DC | PRN

## 2012-10-27 MED ORDER — WARFARIN - PHARMACIST DOSING INPATIENT: Freq: Every day | Status: DC

## 2012-10-27 MED ORDER — HEPARIN (PORCINE) IN NACL 100-0.45 UNIT/ML-% IJ SOLN
1250.0000 [IU]/h | INTRAMUSCULAR | Status: DC
  Administered 2012-10-27 – 2012-10-28 (×3): 1700 [IU]/h via INTRAVENOUS
  Administered 2012-10-29: 1250 [IU]/h via INTRAVENOUS
  Filled 2012-10-27 (×5): qty 250

## 2012-10-27 MED ORDER — HYDROCODONE-ACETAMINOPHEN 5-325 MG PO TABS
1.0000 | ORAL_TABLET | ORAL | Status: DC | PRN
  Administered 2012-10-27 – 2012-10-29 (×7): 2 via ORAL
  Filled 2012-10-27 (×7): qty 2

## 2012-10-27 MED ORDER — PNEUMOCOCCAL VAC POLYVALENT 25 MCG/0.5ML IJ INJ
0.5000 mL | INJECTION | INTRAMUSCULAR | Status: AC
  Filled 2012-10-27 (×2): qty 0.5

## 2012-10-27 MED ORDER — ACETAMINOPHEN 325 MG PO TABS
650.0000 mg | ORAL_TABLET | Freq: Four times a day (QID) | ORAL | Status: DC | PRN
  Administered 2012-10-30: 650 mg via ORAL
  Filled 2012-10-27: qty 2

## 2012-10-27 MED ORDER — WARFARIN SODIUM 10 MG PO TABS
10.0000 mg | ORAL_TABLET | Freq: Once | ORAL | Status: AC
  Administered 2012-10-27: 10 mg via ORAL
  Filled 2012-10-27: qty 1

## 2012-10-27 MED ORDER — OXYCODONE-ACETAMINOPHEN 5-325 MG PO TABS
2.0000 | ORAL_TABLET | Freq: Once | ORAL | Status: AC
  Administered 2012-10-27: 2 via ORAL
  Filled 2012-10-27: qty 2

## 2012-10-27 NOTE — Progress Notes (Signed)
During [facility] ED 10/27/12 visit pt was seen by [facility] liaison  Pt offered services to assist with finding a [place] self pay provider, resources & health reform information Pt informed liaison he goes to [place] [facility] and sees a dr Danley Danker? [employer] discount found in Liaison's data base to have expired in September 2012

## 2012-10-27 NOTE — ED Notes (Signed)
PA stated that pt is to admitted for DVT

## 2012-10-27 NOTE — ED Notes (Signed)
Venous doppler completed.  

## 2012-10-27 NOTE — ED Notes (Signed)
Pt reports recurrent  R/knee pain and swelling. Pain unresponsive to Tramadol

## 2012-10-27 NOTE — H&P (Signed)
[facility] History and Physical  Malik Mullins WUJ:[id number] DOB: [birth year] DOA: 10/27/2012  Referring physician: ED PCP: Provider Not In System   Chief Complaint: Bilateral leg pains worsened over past few days  HPI:  57 year old male with history off hypertension, left leg DVT in the 80s and treated, history of arthritis of the knee presented to the ED with worsening pain over bilateral knees and right lower thigh worsened since past 2-3 days. Patient denies any history of trauma, fever or chills. The pain however but significantly worsened not improved with over-the-counter NSAIDs and impaired with his ambulation. In the ED his vitals were stable. However Doppler of his lower extremity done showed bilateral DVT. Patient denies any recent surgery or travel. Informs being ambulatory despite his knee pains and walks almost on 2 miles every day. He is an active smoker and is is currently smoking 3-4 cigarettes daily. Denies any alcohol use. He denies any chest pain, palpitations, shortness of breath, cough, hemoptysis, abdominal pain, nausea, vomiting, bowel or urinary symptoms. Denies any change in weight or appetite. He does not have insurance at this time and follows with [facility] clinic approximately once a year and gets medications for his blood pressure to the mail. Denies history of blood clots in family.  Review of Systems:  Constitutional: Denies fever, chills, diaphoresis, appetite change and fatigue.  HEENT: Denies photophobia, eye pain, redness, hearing loss, ear pain, congestion, sore throat, rhinorrhea, sneezing, mouth sores, trouble swallowing, neck pain, neck stiffness and tinnitus.   Respiratory: Denies SOB, DOE, cough, chest tightness,  and wheezing.   Cardiovascular: Denies chest pain, palpitations and leg swelling.  Gastrointestinal: Denies nausea, vomiting, abdominal pain, diarrhea, constipation, blood in stool and abdominal distention.  Genitourinary: Denies  dysuria, urgency, frequency, hematuria, flank pain and difficulty urinating.  Musculoskeletal: Denies myalgias, back pain, positive for knee joint pain and swelling,  arthralgias and gait problem.  Skin: Denies pallor, rash and wound.  Neurological: Denies dizziness, seizures, syncope, weakness, light-headedness, numbness and headaches.  Hematological: Denies adenopathy. Easy bruising, personal or family bleeding history  Psychiatric/Behavioral: Denies suicidal ideation, mood changes, confusion, nervousness, sleep disturbance and agitation   Past Medical History  Diagnosis Date  . Hypertension   . Back pain   . DVT (deep venous thrombosis)     left leg  . Knee joint pain    Past Surgical History  Procedure Laterality Date  . Mandible surgery     Social History:  reports that he has been smoking Cigarettes.  He has been smoking about 0.00 packs per day. He has never used smokeless tobacco. He reports that  drinks alcohol. He reports that he does not use illicit drugs.  Allergies  Allergen Reactions  . Lisinopril Swelling  . Tramadol Nausea And Vomiting    Family History  Problem Relation Age of Onset  . Hypertension Mother     Prior to Admission medications   Medication Sig Start Date End Date Taking? Authorizing Provider  albuterol (PROVENTIL HFA;VENTOLIN HFA) 108 (90 BASE) MCG/ACT inhaler Inhale 2 puffs into the lungs every 6 (six) hours as needed. For shortness of breath   Yes Historical Provider, MD  Multiple Vitamin (MULTIVITAMIN WITH MINERALS) TABS Take 1 tablet by mouth every morning.    Yes Historical Provider, MD  PRESCRIPTION MEDICATION Take 1 tablet by mouth every morning. Blood pressure medication.   Yes Historical Provider, MD  tetrahydrozoline 0.05 % ophthalmic solution Place 2 drops into both eyes 4 (four) times daily as needed (  For eye irritation.).   Yes Historical Provider, MD    Physical Exam:  Filed Vitals:   10/27/12 0934 10/27/12 1253  BP: 129/75  93/63  Pulse: 79 73  Temp: 97.5 F (36.4 C)   TempSrc: Oral   Resp: 16 18  SpO2: 99% 98%    Constitutional: Vital signs reviewed.  Patient is a well-developed and well-nourished in no acute distress and cooperative with exam. Alert and oriented x3.  Head: Normocephalic and atraumatic Ear: TM normal bilaterally Mouth: no erythema or exudates, MMM Eyes: PERRL, EOMI, conjunctivae normal, No scleral icterus.  Neck: Supple, Trachea midline normal ROM, No JVD, mass, thyromegaly, or carotid bruit present.  Cardiovascular: RRR, S1 normal, S2 normal, no MRG, pulses symmetric and intact bilaterally Pulmonary/Chest: CTAB, no wheezes, rales, or rhonchi Abdominal: Soft. Non-tender, non-distended, bowel sounds are normal, no masses, organomegaly, or guarding present.  GU: no CVA tenderness Musculoskeletal: Increased warmth over right knee and upper thigh. Good range of motion. Tenderness to palpation over the knees and right upper thigh  Ext: no edema and no cyanosis, pulses palpable bilaterally (DP and PT) Hematology: no cervical, inginal, or axillary adenopathy.  Neurological: A&O x3, Strenght is normal and symmetric bilaterally, cranial nerve II-XII are grossly intact, no focal motor deficit, sensory intact to light touch bilaterally.  Skin: Warm, dry and intact. No rash, cyanosis, or clubbing.  Psychiatric: Normal mood and affect. speech and behavior is normal. Judgment and thought content normal. Cognition and memory are normal.   Labs on Admission:  Basic Metabolic Panel:  Recent Labs Lab 10/27/12 1145  NA 137  K 4.4  CL 101  CO2 28  GLUCOSE 110*  BUN 19  CREATININE 1.27  CALCIUM 9.3   Liver Function Tests: No results found for this basename: AST, ALT, ALKPHOS, BILITOT, PROT, ALBUMIN,  in the last 168 hours No results found for this basename: LIPASE, AMYLASE,  in the last 168 hours No results found for this basename: AMMONIA,  in the last 168 hours CBC:  Recent Labs Lab  10/27/12 1145  WBC 7.3  NEUTROABS 4.3  HGB 15.5  HCT 46.9  MCV 82.7  PLT 268   Cardiac Enzymes: No results found for this basename: CKTOTAL, CKMB, CKMBINDEX, TROPONINI,  in the last 168 hours BNP: No components found with this basename: POCBNP,  CBG: No results found for this basename: GLUCAP,  in the last 168 hours  Radiological Exams on Admission: Dg Chest 2 View  10/27/2012  *RADIOLOGY REPORT*  Clinical Data: Cough and shortness of breath  CHEST - 2 VIEW  Comparison: 06/01/2009  Findings: Heart size is normal.  Mediastinal shadows are normal. There is patchy infiltrate in the left lower lobe consistent with pneumonia.  No heart failure or effusions.  Bony structures are unremarkable.  Chronic degenerative changes effect the shoulders.  IMPRESSION: Patchy infiltrate in the left lower lobe consistent with pneumonia.   Original Report Authenticated By: Paulina Fusi, M.D.    Dg Knee Complete 4 Views Right  10/27/2012  *RADIOLOGY REPORT*  Clinical Data: Knee pain.  Joint swelling  RIGHT KNEE - COMPLETE 4+ VIEW  Comparison: None  Findings: No joint effusion identified.  There is no fracture or subluxation identified.  There is no radio-opaque foreign body or soft tissue calcification.  Sharpening tibial spines and marginal spur formation is noted consistent with degenerative joint disease.  IMPRESSION:  1.  Osteoarthritis. 2.  No acute findings.   Original Report Authenticated By: Signa Kell, M.D.  Assessment/Plan Principal Problem:   DVT of lower extremity, bilateral Admit to observation No clear etiology. He does have history of left lower extremity blood clot in the ED and was treated for it. Continue with when necessary Percocet for pain Hypercoagulable workup sent prior to and the right is started in the ED. Patient will be started on IV heparin drip and Coumadin per pharmacy consult. He does not have insurance and will likely not be able to afford Lovenox or new or  anticoagulants. I will ask for social work consult to help him with prescriptions if possible. Monitor H&H and platelets.  Active Problems:   Hypertension Patient unable to recall blood pressure medication he is on. His blood pressure is currently stable and we will monitor    Osteoarthritis of both knees Continue his pain control as outlined above  Abnormal chest x-ray findings Chest x-ray done done in ED as patient informs off having ? TB shot at the [place] one year ago. X-ray shows a left lower lobe infiltrate. Patient does not have any symptoms off and pneumonia whatsoever and clinical exam is completely benign. I would not put him on any antibiotics at this time.  Diet: Low sodium DVT prophylaxis:on full anticoagulation  Code Status: Full code Family Communication: None at bedside Disposition Plan: Home once stable  Eddie North [facility] Pager [phone]  If 7PM-7AM, please contact night-coverage [email or web address] Password [facility] 10/27/2012, 2:14 PM    Total time spent on admission: 70 minutes

## 2012-10-27 NOTE — Progress Notes (Signed)
ANTICOAGULATION CONSULT NOTE - Initial Consult  Pharmacy Consult for Heparin/Warfarin Indication: VTE treatment  Allergies  Allergen Reactions  . Lisinopril Swelling  . Tramadol Nausea And Vomiting    Patient Measurements: Height: 6\' 1"  (185.4 cm) Weight: 210 lb (95.255 kg) IBW/kg (Calculated) : 79.9 Heparin Dosing Weight: 95kg  Vital Signs: Temp: 97.5 F (36.4 C) (02/12 0934) Temp src: Oral (02/12 0934) BP: 93/63 mmHg (02/12 1253) Pulse Rate: 73 (02/12 1253)  Labs:  Recent Labs  10/27/12 1145  HGB 15.5  HCT 46.9  PLT 268  APTT 36  LABPROT 12.0  INR 0.89  CREATININE 1.27    Estimated Creatinine Clearance: 72.5 ml/min (by C-G formula based on Cr of 1.27).   Medical History: Past Medical History  Diagnosis Date  . Hypertension   . Back pain   . DVT (deep venous thrombosis)     left leg  . Knee joint pain     Medications:  Scheduled:  . [COMPLETED] oxyCODONE-acetaminophen  2 tablet Oral Once  . [START ON 10/28/2012] pneumococcal 23 valent vaccine  0.5 mL Intramuscular Tomorrow-1000   Infusions:   PRN:   Assessment: 57 yo M with history of left leg DVT,now presents with Bilateral DVT(per doppler)and Left Femoral DVT  Goal of Therapy:  Heparin level: 0.3-0.7 units/ml Monitor platelets by anticoagulation protocol: Yes   Plan:  Heparin loading dose: 5000 units IV x 1 Heparin maintenance dose= 1700 units/hour Warfarin 10mg  po x 1 Heparin level in 6 hours and daily Daily PT/INR and CBC   Loletta Specter 10/27/2012,3:06 PM

## 2012-10-27 NOTE — Progress Notes (Signed)
PHARMACY BRIEF NOTE - HEPARIN Indication: DVT Heparin Level Goal: 0.3 - 0.7 units/mL  Heparin level 0.51 after 5000 units IV x 1 and initiation of 1700 units/hr IV infusion.  RN reports no bleeding, no problems with infusion.  Assessment: Heparin level therapeutic.  Plan; Continue present heparin rate (1700 units/hr). Check confirmatory heparin level in AM. CBC in AM.  Elie Goody, PharmD, BCPS Pager: [phone] 10/27/2012  10:39 PM

## 2012-10-27 NOTE — ED Provider Notes (Signed)
History     CSN: [id number]  Arrival date & time 10/27/12  0930   First MD Initiated Contact with Patient 10/27/12 [phone]      Chief Complaint  Patient presents with  . Knee Pain    recurrent r/knee pain  . Joint Swelling    (Consider location/radiation/quality/duration/timing/severity/associated sxs/prior treatment) HPI Comments: Patient is a 57 year old male with a past medical history of DVT who presents with right knee pain for the past 2 days. Patient reports gradual onset and progressive worsening. The pain is aching and severe. Patient has tried tramadol for pain which does not help. Patient reports associated swelling of right knee. The pain and swelling does not radiate. Walking makes the pain worse. Nothing makes the pain better.    Past Medical History  Diagnosis Date  . Hypertension   . Back pain   . DVT (deep venous thrombosis)     left leg  . Knee joint pain     Past Surgical History  Procedure Laterality Date  . Mandible surgery      Family History  Problem Relation Age of Onset  . Hypertension Mother     History  Substance Use Topics  . Smoking status: Current Some Day Smoker  . Smokeless tobacco: Not on file  . Alcohol Use: Yes     Comment: "from time to time"      Review of Systems  Musculoskeletal: Positive for joint swelling and arthralgias.  All other systems reviewed and are negative.    Allergies  Lisinopril and Tramadol  Home Medications   Current Outpatient Rx  Name  Route  Sig  Dispense  Refill  . albuterol (PROVENTIL HFA;VENTOLIN HFA) 108 (90 BASE) MCG/ACT inhaler   Inhalation   Inhale 2 puffs into the lungs every 6 (six) hours as needed. For shortness of breath         . Multiple Vitamin (MULTIVITAMIN WITH MINERALS) TABS   Oral   Take 1 tablet by mouth every morning.          Marland Kitchen PRESCRIPTION MEDICATION   Oral   Take 1 tablet by mouth every morning. Blood pressure medication.         Marland Kitchen tetrahydrozoline 0.05 %  ophthalmic solution   Both Eyes   Place 2 drops into both eyes 4 (four) times daily as needed (For eye irritation.).           BP 129/75  Pulse 79  Temp(Src) 97.5 F (36.4 C) (Oral)  Resp 16  SpO2 99%  Physical Exam  Nursing note and vitals reviewed. Constitutional: He is oriented to person, place, and time. He appears well-developed and well-nourished. No distress.  HENT:  Head: Normocephalic and atraumatic.  Eyes: Conjunctivae and EOM are normal.  Neck: Normal range of motion.  Cardiovascular: Normal rate and regular rhythm.  Exam reveals no gallop and no friction rub.   No murmur heard. Pulmonary/Chest: Effort normal and breath sounds normal. He has no wheezes. He has no rales. He exhibits no tenderness.  Abdominal: Soft. There is no tenderness.  Musculoskeletal: Normal range of motion.  Right popliteal and calf tenderness to palpation. Mild anterior right knee swelling. ROM normal.   Neurological: He is alert and oriented to person, place, and time.  Strength and sensation equal and intact bilaterally. Speech is goal-oriented. Moves limbs without ataxia.   Skin: Skin is warm and dry.  Psychiatric: He has a normal mood and affect. His behavior is normal.  ED Course  Procedures (including critical care time)  Labs Reviewed - No data to display Dg Knee Complete 4 Views Right  10/27/2012  *RADIOLOGY REPORT*  Clinical Data: Knee pain.  Joint swelling  RIGHT KNEE - COMPLETE 4+ VIEW  Comparison: None  Findings: No joint effusion identified.  There is no fracture or subluxation identified.  There is no radio-opaque foreign body or soft tissue calcification.  Sharpening tibial spines and marginal spur formation is noted consistent with degenerative joint disease.  IMPRESSION:  1.  Osteoarthritis. 2.  No acute findings.   Original Report Authenticated By: Signa Kell, M.D.      1. DVT (deep venous thrombosis), bilateral       MDM  11:22 AM Patient given percocet for  pain. Xray shows Osteoarthritis. Patient has right calf tenderness with a history of DVT. Patient will have [place] of right lower extremity.   11:40 AM Patient has bilateral DVT and left femoral DVT. Patient will be admitted.   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 No current symptoms. Patient will have a chest xray.     Emilia Beck, PA-C 10/27/12 1542

## 2012-10-27 NOTE — Progress Notes (Signed)
Right:  DVT noted in the distal femoral vein.  No evidence of superficial thrombosis.  No Baker's cyst.  Left: DVT noted in the popliteal vein.  No evidence of superficial thrombosis.  No Baker's cyst.

## 2012-10-27 NOTE — ED Notes (Signed)
Floor RN unable to take report. Will call back.

## 2012-10-28 DIAGNOSIS — I10 Essential (primary) hypertension: Secondary | ICD-10-CM

## 2012-10-28 DIAGNOSIS — J189 Pneumonia, unspecified organism: Secondary | ICD-10-CM

## 2012-10-28 DIAGNOSIS — I82409 Acute embolism and thrombosis of unspecified deep veins of unspecified lower extremity: Principal | ICD-10-CM

## 2012-10-28 DIAGNOSIS — M1710 Unilateral primary osteoarthritis, unspecified knee: Secondary | ICD-10-CM

## 2012-10-28 LAB — CBC
MCHC: 31.8 g/dL (ref 30.0–36.0)
Platelets: 261 10*3/uL (ref 150–400)
RDW: 13.5 % (ref 11.5–15.5)
WBC: 6 10*3/uL (ref 4.0–10.5)

## 2012-10-28 LAB — HEPARIN LEVEL (UNFRACTIONATED): Heparin Unfractionated: 0.65 IU/mL (ref 0.30–0.70)

## 2012-10-28 LAB — PROTIME-INR
INR: 1 (ref 0.00–1.49)
Prothrombin Time: 13.1 seconds (ref 11.6–15.2)

## 2012-10-28 MED ORDER — DEXTROSE 5 % IV SOLN
500.0000 mg | INTRAVENOUS | Status: DC
  Administered 2012-10-28 – 2012-10-30 (×3): 500 mg via INTRAVENOUS
  Filled 2012-10-28 (×3): qty 500

## 2012-10-28 MED ORDER — DEXTROSE 5 % IV SOLN
1.0000 g | INTRAVENOUS | Status: DC
  Administered 2012-10-28 – 2012-10-30 (×3): 1 g via INTRAVENOUS
  Filled 2012-10-28 (×3): qty 10

## 2012-10-28 MED ORDER — WARFARIN SODIUM 10 MG PO TABS
10.0000 mg | ORAL_TABLET | Freq: Once | ORAL | Status: AC
  Administered 2012-10-28: 10 mg via ORAL
  Filled 2012-10-28: qty 1

## 2012-10-28 NOTE — Progress Notes (Signed)
[facility] PROGRESS NOTE  Malik Mullins ZOX:[id number] DOB: 1956-07-27 DOA: 10/27/2012 PCP: Provider Not In System  Assessment/Plan:  #1 bilateral DVT Continue IV heparin bridge with Coumadin. Hypercoagulable panel is pending. Hours 2-3. Patient stated that prior history of DVT in the 80s. As this is patient's second DVT will likely need to be on anti-coagulation for life.  #2 abnormal chest x-ray/ prob community acquired pneumonia Patient currently afebrile. Patient is asymptomatic. Will go ahead and treat empirically with IV Rocephin and azithromycin. Check a sputum Gram stain and culture. Will follow.  #3 hypertension Blood pressure is currently stable. Will follow.  #4 osteoarthritis of bilateral knees Continue pain management.  #5 prophylaxis On full dose heparin for DVT prophylaxis.   Code Status: Full Family Communication: updated patient no family at bedside Disposition Plan: Home when medically stable.   Consultants:  None  Procedures:  LE Dopplers 10/27/12  CXR 10/27/12/  Antibiotics:  IV Rocephin 10/28/12  IV Azithromycin 10/28/12  HPI/Subjective: Patient states he is feeling better.   Objective: Filed Vitals:   10/27/12 1640 10/27/12 1741 10/27/12 2100 10/28/12 0612  BP: 121/71 120/80 102/66 104/64  Pulse:  58 58 65  Temp: 97.8 F (36.6 C) 98.1 F (36.7 C) 97.9 F (36.6 C) 97.6 F (36.4 C)  TempSrc: Oral Oral Oral Oral  Resp:      Height:      Weight:      SpO2:  97% 97% 97%    Intake/Output Summary (Last 24 hours) at 10/28/12 1126 Last data filed at 10/28/12 1008  Gross per 24 hour  Intake 308.55 ml  Output    920 ml  Net -611.45 ml   Filed Weights   10/27/12 1454  Weight: 95.255 kg (210 lb)    Exam:   General:  NAD  Cardiovascular: RRR. No LE edema  Respiratory: CTAB  Abdomen: Soft/NT/ND/+BS  Data Reviewed: Basic Metabolic Panel:  Recent Labs Lab 10/27/12 1145  NA 137  K 4.4  CL 101  CO2 28  GLUCOSE  110*  BUN 19  CREATININE 1.27  CALCIUM 9.3   Liver Function Tests: No results found for this basename: AST, ALT, ALKPHOS, BILITOT, PROT, ALBUMIN,  in the last 168 hours No results found for this basename: LIPASE, AMYLASE,  in the last 168 hours No results found for this basename: AMMONIA,  in the last 168 hours CBC:  Recent Labs Lab 10/27/12 1145 10/28/12 0410  WBC 7.3 6.0  NEUTROABS 4.3  --   HGB 15.5 14.4  HCT 46.9 45.3  MCV 82.7 83.3  PLT 268 261   Cardiac Enzymes: No results found for this basename: CKTOTAL, CKMB, CKMBINDEX, TROPONINI,  in the last 168 hours BNP (last 3 results) No results found for this basename: PROBNP,  in the last 8760 hours CBG: No results found for this basename: GLUCAP,  in the last 168 hours  No results found for this or any previous visit (from the past 240 hour(s)).   Studies: Dg Chest 2 View  10/27/2012  *RADIOLOGY REPORT*  Clinical Data: Cough and shortness of breath  CHEST - 2 VIEW  Comparison: 06/01/2009  Findings: Heart size is normal.  Mediastinal shadows are normal. There is patchy infiltrate in the left lower lobe consistent with pneumonia.  No heart failure or effusions.  Bony structures are unremarkable.  Chronic degenerative changes effect the shoulders.  IMPRESSION: Patchy infiltrate in the left lower lobe consistent with pneumonia.   Original Report Authenticated By: Paulina Fusi, M.D.  Dg Knee Complete 4 Views Right  10/27/2012  *RADIOLOGY REPORT*  Clinical Data: Knee pain.  Joint swelling  RIGHT KNEE - COMPLETE 4+ VIEW  Comparison: None  Findings: No joint effusion identified.  There is no fracture or subluxation identified.  There is no radio-opaque foreign body or soft tissue calcification.  Sharpening tibial spines and marginal spur formation is noted consistent with degenerative joint disease.  IMPRESSION:  1.  Osteoarthritis. 2.  No acute findings.   Original Report Authenticated By: Signa Kell, M.D.     Scheduled Meds: .  azithromycin  500 mg Intravenous Q24H  . cefTRIAXone (ROCEPHIN)  IV  1 g Intravenous Q24H  . pneumococcal 23 valent vaccine  0.5 mL Intramuscular Tomorrow-1000  . warfarin  10 mg Oral ONCE-1800  . Warfarin - Pharmacist Dosing Inpatient   Does not apply q1800   Continuous Infusions: . heparin 1,700 Units/hr (10/28/12 0224)    Principal Problem:   DVT of lower extremity, bilateral Active Problems:   Hypertension   Osteoarthritis of both knees   CAP (community acquired pneumonia)    Time spent: > 35 mins    [facility]  [facility] Pager [phone]. If 8PM-8AM, please contact night-coverage at [email or web address], password [facility] 10/28/2012, 11:26 AM  LOS: 1 day

## 2012-10-28 NOTE — ED Provider Notes (Signed)
Medical screening examination/treatment/procedure(s) were performed by non-physician practitioner and as supervising physician I was immediately available for consultation/collaboration.  Toy Baker, MD 10/28/12 [phone]

## 2012-10-28 NOTE — Progress Notes (Signed)
ANTICOAGULATION CONSULT NOTE - Follow Up Consult  Pharmacy Consult for Heparin Indication: DVT  Allergies  Allergen Reactions  . Lisinopril Swelling  . Tramadol Nausea And Vomiting    Patient Measurements: Height: 6\' 1"  (185.4 cm) Weight: 210 lb (95.255 kg) IBW/kg (Calculated) : 79.9 Heparin Dosing Weight: 95kg  Vital Signs: Temp: 97.6 F (36.4 C) (02/13 0612) Temp src: Oral (02/13 0612) BP: 104/64 mmHg (02/13 0612) Pulse Rate: 65 (02/13 0612)  Labs:  Recent Labs  10/27/12 1145 10/27/12 2149 10/28/12 0410  HGB 15.5  --  14.4  HCT 46.9  --  45.3  PLT 268  --  261  APTT 36  --   --   LABPROT 12.0  --  13.1  INR 0.89  --  1.00  HEPARINUNFRC  --  0.51 0.65  CREATININE 1.27  --   --     Estimated Creatinine Clearance: 72.5 ml/min (by C-G formula based on Cr of 1.27).   Medications:  2/12 Heparin bolus 5000 units x 1  2/12 >>Heparin infusion @ 1700 units/hr 2/12 >> Warfarin 10mg  x 1   Assessment: 57 yoM with bilateral DVTs on D#2 IV heparin/Coumadin bridge.  To note, pt has no insurance and cannot afford LMWH or NOACs.  Will have to f/u closely on INR monitoring after discharge.    INR subtherapeutic, small rise 0.89-->1.00 after 1 dose of warfarin  Heparin level therapeutic @ 0.65  CBC ok, no bleeding noted  Renal: CrCl ~ 73 ml/min, SCr ok.    Pt also on azithromycin/CTX.  Macrolides can cause elevations in INR.    Goal of Therapy:  INR 2-3 Heparin level 0.3-0.7 units/ml Monitor platelets by anticoagulation protocol: Yes   Plan:  1.  Warfarin 10mg  po x 1 tonight 2.  Continue heparin at current rate (1700 units/hr =17 ml/hr) 3.  F/u daily PT/INR, heparin level, CBC  Donelda Mailhot E 10/28/2012,8:11 AM

## 2012-10-29 LAB — BASIC METABOLIC PANEL
BUN: 17 mg/dL (ref 6–23)
Calcium: 9.2 mg/dL (ref 8.4–10.5)
GFR calc Af Amer: 58 mL/min — ABNORMAL LOW (ref >90)
GFR calc non Af Amer: 50 mL/min — ABNORMAL LOW (ref >90)
Glucose, Bld: 100 mg/dL — ABNORMAL HIGH (ref 70–99)
Potassium: 4.2 mEq/L (ref 3.5–5.1)
Sodium: 138 mEq/L (ref 135–145)

## 2012-10-29 LAB — PROTEIN C, TOTAL: Protein C, Total: 91 % (ref 72–160)

## 2012-10-29 LAB — LUPUS ANTICOAGULANT PANEL
DRVVT: 40 secs (ref <42.9)
PTT Lupus Anticoagulant: 43.6 secs — ABNORMAL HIGH (ref 28.0–43.0)
PTTLA 4:1 Mix: 39.6 secs (ref 28.0–43.0)

## 2012-10-29 LAB — CARDIOLIPIN ANTIBODIES, IGG, IGM, IGA
Anticardiolipin IgA: 15 APL U/mL (ref <22)
Anticardiolipin IgG: 7 GPL U/mL — ABNORMAL LOW (ref <23)
Anticardiolipin IgM: 7 MPL U/mL — ABNORMAL LOW (ref <11)

## 2012-10-29 LAB — PROTIME-INR
INR: 1.02 (ref 0.00–1.49)
Prothrombin Time: 13.3 seconds (ref 11.6–15.2)

## 2012-10-29 LAB — CBC
Hemoglobin: 14.8 g/dL (ref 13.0–17.0)
MCHC: 32.4 g/dL (ref 30.0–36.0)
Platelets: 246 10*3/uL (ref 150–400)
RBC: 5.53 MIL/uL (ref 4.22–5.81)

## 2012-10-29 LAB — FACTOR 5 LEIDEN

## 2012-10-29 LAB — BETA-2-GLYCOPROTEIN I ABS, IGG/M/A: Beta-2-Glycoprotein I IgM: 3 M Units (ref <20)

## 2012-10-29 MED ORDER — SODIUM CHLORIDE 0.9 % IV SOLN
INTRAVENOUS | Status: DC
  Administered 2012-10-29: 08:00:00 via INTRAVENOUS
  Administered 2012-10-29: 100 mL/h via INTRAVENOUS
  Administered 2012-10-30: 18:00:00 via INTRAVENOUS
  Administered 2012-10-30: 100 mL/h via INTRAVENOUS
  Administered 2012-10-31 – 2012-11-01 (×2): via INTRAVENOUS

## 2012-10-29 MED ORDER — WARFARIN VIDEO
Freq: Once | Status: AC
  Administered 2012-10-29: 12:00:00

## 2012-10-29 MED ORDER — WARFARIN SODIUM 2.5 MG PO TABS
12.5000 mg | ORAL_TABLET | Freq: Once | ORAL | Status: AC
  Administered 2012-10-29: 12.5 mg via ORAL
  Filled 2012-10-29: qty 1

## 2012-10-29 MED ORDER — HEPARIN (PORCINE) IN NACL 100-0.45 UNIT/ML-% IJ SOLN
1450.0000 [IU]/h | INTRAMUSCULAR | Status: DC
  Filled 2012-10-29: qty 250

## 2012-10-29 MED ORDER — PATIENT'S GUIDE TO USING COUMADIN BOOK
Freq: Once | Status: AC
  Administered 2012-10-29: 12:00:00
  Filled 2012-10-29: qty 1

## 2012-10-29 NOTE — Progress Notes (Signed)
ANTICOAGULATION CONSULT NOTE - Follow Up Consult  Pharmacy Consult for Heparin/coumadin Indication: DVT  Allergies  Allergen Reactions  . Lisinopril Swelling  . Tramadol Nausea And Vomiting    Patient Measurements: Height: 6\' 1"  (185.4 cm) Weight: 210 lb (95.255 kg) IBW/kg (Calculated) : 79.9 Heparin Dosing Weight: 95kg  Vital Signs: Temp: 98.4 F (36.9 C) (02/14 1400) Temp src: Oral (02/14 1400) BP: 100/54 mmHg (02/14 1400) Pulse Rate: 71 (02/14 1400)  Labs:  Recent Labs  10/27/12 1145  10/28/12 0410 10/29/12 0440 10/29/12 1314 10/29/12 1937  HGB 15.5  --  14.4 14.8  --   --   HCT 46.9  --  45.3 45.7  --   --   PLT 268  --  261 246  --   --   APTT 36  --   --   --   --   --   LABPROT 12.0  --  13.1 13.3  --   --   INR 0.89  --  1.00 1.02  --   --   HEPARINUNFRC  --   < > 0.65 0.92* 0.35 0.27*  CREATININE 1.27  --   --  1.49*  --   --   < > = values in this interval not displayed.  Estimated Creatinine Clearance: 61.8 ml/min (by C-G formula based on Cr of 1.49).   Assessment: 83 yoM with bilateral DVTs on D#2 IV heparin/Coumadin bridge.   Heparin level 0.27, decreased to subtherapeutic range after dose reduction for high level this morning.  Goal of Therapy:  INR 2-3 Heparin level 0.3-0.7 units/ml Monitor platelets by anticoagulation protocol: Yes   Plan:   Increase to heparin IV infusion at 1450 units/hr  Heparin level in 6 hours  Daily heparin level and CBC  Continue to monitor H&H and platelets   Lynann Beaver PharmD, BCPS Pager [phone] 10/29/2012 8:28 PM

## 2012-10-29 NOTE — Clinical Social Work Note (Signed)
CSW received consult for medication assistance. RN CMs can assist with this need and see if Pt qualifies. Financial counselor was notified as well to follow up with the Pt. No other CSW needs identified currently. CSW signing off.   Doreen Salvage, LCSW ICU/Stepdown Clinical Social Worker [facility] Cell [phone] Hours 8am-1200pm M-F

## 2012-10-29 NOTE — Progress Notes (Signed)
PHARMACY BRIEF NOTE - HEPARIN  Indication: Bilateral DVT Goal Heparin Level: 0.3 - 0.7  Heparin level 0.92 on 1700 units/hr  Hgb 14.8, Hct 45.7, Pltc 246K  RN reports no problems with infusion and no bleeding.  Assessment: Heparin level supratherapeutic  Plan: 1. Reduce heparin to 1250 units/hr. 2. Recheck heparin level at 1pm. 3. Warfarin orders and full consult note to follow later today.  Elie Goody, PharmD, BCPS Pager: [phone] 10/29/2012  6:45 AM

## 2012-10-29 NOTE — Progress Notes (Signed)
[facility] PROGRESS NOTE  Malik Mullins ZOX:[id number] DOB: [birth year] DOA: 10/27/2012 PCP: Provider Not In System  Assessment/Plan:  #1 bilateral DVT Continue IV heparin bridge with Coumadin. Hypercoagulable panel is pending. Hours 2-3. Patient stated that prior history of DVT in the 80s. As this is patient's second DVT will likely need to be on anti-coagulation for life.  #2 abnormal chest x-ray/ prob community acquired pneumonia Patient currently afebrile. Patient is asymptomatic. Continue empirically with IV Rocephin and azithromycin.   #3 hypertension Blood pressure is currently stable. Will follow.  #4 osteoarthritis of bilateral knees Continue pain management.  #5 prophylaxis On full dose heparin for DVT prophylaxis.   Code Status: Full Family Communication: updated patient no family at bedside Disposition Plan: Home when medically stable.   Consultants:  None  Procedures:  LE Dopplers 10/27/12  CXR 10/27/12/  Antibiotics:  IV Rocephin 10/28/12  IV Azithromycin 10/28/12  HPI/Subjective: Patient states he is feeling better. No complaints.  Objective: Filed Vitals:   10/28/12 1406 10/28/12 2221 10/29/12 0459 10/29/12 1400  BP: 141/69 107/72 106/69 100/54  Pulse: 77 72 67 71  Temp: 97.9 F (36.6 C) 98 F (36.7 C) 97.7 F (36.5 C) 98.4 F (36.9 C)  TempSrc: Oral Oral Oral Oral  Resp: 18 18 18 18   Height:      Weight:      SpO2: 99% 97% 96% 99%    Intake/Output Summary (Last 24 hours) at 10/29/12 1455 Last data filed at 10/29/12 1400  Gross per 24 hour  Intake 943.63 ml  Output   3400 ml  Net -2456.37 ml   Filed Weights   10/27/12 1454  Weight: 95.255 kg (210 lb)    Exam:   General:  NAD  Cardiovascular: RRR. No LE edema  Respiratory: CTAB  Abdomen: Soft/NT/ND/+BS  Data Reviewed: Basic Metabolic Panel:  Recent Labs Lab 10/27/12 1145 10/29/12 0440  NA 137 138  K 4.4 4.2  CL 101 100  CO2 28 29  GLUCOSE 110* 100*   BUN 19 17  CREATININE 1.27 1.49*  CALCIUM 9.3 9.2   Liver Function Tests: No results found for this basename: AST, ALT, ALKPHOS, BILITOT, PROT, ALBUMIN,  in the last 168 hours No results found for this basename: LIPASE, AMYLASE,  in the last 168 hours No results found for this basename: AMMONIA,  in the last 168 hours CBC:  Recent Labs Lab 10/27/12 1145 10/28/12 0410 10/29/12 0440  WBC 7.3 6.0 4.3  NEUTROABS 4.3  --   --   HGB 15.5 14.4 14.8  HCT 46.9 45.3 45.7  MCV 82.7 83.3 82.6  PLT 268 261 246   Cardiac Enzymes: No results found for this basename: CKTOTAL, CKMB, CKMBINDEX, TROPONINI,  in the last 168 hours BNP (last 3 results) No results found for this basename: PROBNP,  in the last 8760 hours CBG: No results found for this basename: GLUCAP,  in the last 168 hours  No results found for this or any previous visit (from the past 240 hour(s)).   Studies: No results found.  Scheduled Meds: . azithromycin  500 mg Intravenous Q24H  . cefTRIAXone (ROCEPHIN)  IV  1 g Intravenous Q24H  . warfarin  12.5 mg Oral ONCE-1800  . Warfarin - Pharmacist Dosing Inpatient   Does not apply q1800   Continuous Infusions: . sodium chloride 100 mL/hr at 10/29/12 0801  . heparin 1,250 Units/hr (10/29/12 1047)    Principal Problem:   DVT of lower extremity, bilateral Active Problems:  Hypertension   Osteoarthritis of both knees   CAP (community acquired pneumonia)    Time spent: > 35 mins    [facility]  [facility] Pager [phone]. If 8PM-8AM, please contact night-coverage at [email or web address], password [email or web address] 10/29/2012, 2:55 PM  LOS: 2 days

## 2012-10-29 NOTE — Progress Notes (Addendum)
ANTICOAGULATION CONSULT NOTE - Follow Up Consult  Pharmacy Consult for Heparin/coumadin Indication: DVT  Allergies  Allergen Reactions  . Lisinopril Swelling  . Tramadol Nausea And Vomiting    Patient Measurements: Height: 6\' 1"  (185.4 cm) Weight: 210 lb (95.255 kg) IBW/kg (Calculated) : 79.9 Heparin Dosing Weight: 95kg  Vital Signs: Temp: 97.7 F (36.5 C) (02/14 0459) Temp src: Oral (02/14 0459) BP: 106/69 mmHg (02/14 0459) Pulse Rate: 67 (02/14 0459)  Labs:  Recent Labs  10/27/12 1145 10/27/12 2149 10/28/12 0410 10/29/12 0440  HGB 15.5  --  14.4 14.8  HCT 46.9  --  45.3 45.7  PLT 268  --  261 246  APTT 36  --   --   --   LABPROT 12.0  --  13.1 13.3  INR 0.89  --  1.00 1.02  HEPARINUNFRC  --  0.51 0.65 0.92*  CREATININE 1.27  --   --  1.49*    Estimated Creatinine Clearance: 61.8 ml/min (by C-G formula based on Cr of 1.49).   Assessment: 58 yoM with bilateral DVTs on D#2 IV heparin/Coumadin bridge.  To note, pt has no insurance and cannot afford LMWH or NOACs.  Will have to f/u closely on INR monitoring after discharge.    INR subtherapeutic, small rise 0.89 (baseline) -->1.02 after 2 doses of warfarin 10mg   Heparin level therapeutic elevated this am, rate adjusted  CBC ok, no bleeding noted  Renal: CrCl ~ 73 ml/min, SCr trended up this am   Pt also on azithromycin/CTX.  Macrolides can cause elevations in INR.    Goal of Therapy:  INR 2-3 Heparin level 0.3-0.7 units/ml Monitor platelets by anticoagulation protocol: Yes   Plan:  Day #3 VTE overlap, needs minimum 5 days of overlap with 24h of INR >= 2. 1.  Increase Warfarin to 12.5mg  po x 1 tonight 2.  F/u heparin level this afternoon following rate adjustment this am 3.  F/u daily PT/INR, heparin level, CBC  Zeigler, Tad Moore 10/29/2012,10:10 AM   Addendum:   Repeat heparin level has returned at 0.35 on 1250 units/hr  Plan: 1. Repeat level is within goal, needs confirmatory level later  tonight  Juliette Alcide, PharmD, BCPS.   Pager: [phone]  10/29/2012 2:34 PM

## 2012-10-30 LAB — BASIC METABOLIC PANEL
BUN: 17 mg/dL (ref 6–23)
CO2: 26 mEq/L (ref 19–32)
Calcium: 8.8 mg/dL (ref 8.4–10.5)
Creatinine, Ser: 1.36 mg/dL — ABNORMAL HIGH (ref 0.50–1.35)
GFR calc non Af Amer: 56 mL/min — ABNORMAL LOW (ref >90)
Glucose, Bld: 101 mg/dL — ABNORMAL HIGH (ref 70–99)
Sodium: 138 mEq/L (ref 135–145)

## 2012-10-30 LAB — CBC
Hemoglobin: 14.2 g/dL (ref 13.0–17.0)
MCH: 26.8 pg (ref 26.0–34.0)
MCHC: 32.3 g/dL (ref 30.0–36.0)
MCV: 83 fL (ref 78.0–100.0)
RBC: 5.29 MIL/uL (ref 4.22–5.81)

## 2012-10-30 LAB — PROTIME-INR: Prothrombin Time: 13.8 seconds (ref 11.6–15.2)

## 2012-10-30 MED ORDER — AMOXICILLIN-POT CLAVULANATE 875-125 MG PO TABS
1.0000 | ORAL_TABLET | Freq: Two times a day (BID) | ORAL | Status: DC
  Administered 2012-10-30 – 2012-11-01 (×5): 1 via ORAL
  Filled 2012-10-30 (×7): qty 1

## 2012-10-30 MED ORDER — HEPARIN (PORCINE) IN NACL 100-0.45 UNIT/ML-% IJ SOLN
1650.0000 [IU]/h | INTRAMUSCULAR | Status: DC
  Administered 2012-10-30: 1650 [IU]/h via INTRAVENOUS
  Filled 2012-10-30: qty 250

## 2012-10-30 MED ORDER — ENOXAPARIN SODIUM 150 MG/ML ~~LOC~~ SOLN
150.0000 mg | SUBCUTANEOUS | Status: DC
  Administered 2012-10-30 – 2012-10-31 (×2): 150 mg via SUBCUTANEOUS
  Filled 2012-10-30 (×3): qty 1

## 2012-10-30 MED ORDER — WARFARIN SODIUM 7.5 MG PO TABS
15.0000 mg | ORAL_TABLET | Freq: Once | ORAL | Status: AC
  Administered 2012-10-30: 15 mg via ORAL
  Filled 2012-10-30: qty 2

## 2012-10-30 NOTE — Progress Notes (Signed)
ANTICOAGULATION CONSULT NOTE - Follow Up Consult  Pharmacy Consult for Heparin/coumadin Indication: DVT  Allergies  Allergen Reactions  . Lisinopril Swelling  . Tramadol Nausea And Vomiting    Patient Measurements: Height: 6\' 1"  (185.4 cm) Weight: 210 lb (95.255 kg) IBW/kg (Calculated) : 79.9 Heparin Dosing Weight: 95kg  Vital Signs: Temp: 97.9 F (36.6 C) (02/14 2125) Temp src: Oral (02/14 2125) BP: 110/73 mmHg (02/14 2125) Pulse Rate: 74 (02/14 2125)  Labs:  Recent Labs  10/27/12 1145  10/28/12 0410 10/29/12 0440 10/29/12 1314 10/29/12 1937 10/30/12 0341  HGB 15.5  --  14.4 14.8  --   --  14.2  HCT 46.9  --  45.3 45.7  --   --  43.9  PLT 268  --  261 246  --   --  208  APTT 36  --   --   --   --   --   --   LABPROT 12.0  --  13.1 13.3  --   --  13.8  INR 0.89  --  1.00 1.02  --   --  1.07  HEPARINUNFRC  --   < > 0.65 0.92* 0.35 0.27* 0.21*  CREATININE 1.27  --   --  1.49*  --   --  1.36*  < > = values in this interval not displayed.  Estimated Creatinine Clearance: 67.7 ml/min (by C-G formula based on Cr of 1.36).   Assessment: 10 yoM with bilateral DVTs on D#2 IV heparin/Coumadin bridge.   Heparin level remains subtherapeutic @ 0.21 despite rate increase.    No complications noted   Increase heparin to 1650 units/hr  Check heparin level in 6 hr  Goal of Therapy:  INR 2-3 Heparin level 0.3-0.7 units/ml Monitor platelets by anticoagulation protocol: Yes   Plan:   Increase to heparin IV infusion at 1650 units/hr  Heparin level in 6 hours  Daily heparin level and CBC  Continue to monitor H&H and platelets  Terrilee Files, PharmD 10/30/2012 5:22 AM

## 2012-10-30 NOTE — Care Management Note (Signed)
    Page 1 of 2   11/01/2012     12:36:19 PM   CARE MANAGEMENT NOTE 11/01/2012  Patient:  Malik Mullins, Malik Mullins   Account Number:  [id number]  Date Initiated:  10/29/2012  Documentation initiated by:  Lorenda Ishihara  Subjective/Objective Assessment:   57 yo male admitted with bilateral LE DVT. PTA lived at home alone.     Action/Plan:   Home when stable   Anticipated DC Date:  10/31/2012   Anticipated DC Plan:  HOME/SELF CARE      DC Planning Services  CM consult  PCP issues      [facility] Choice  HOME HEALTH   Choice offered to / List presented to:  [name]        HH arranged  HH-1 RN  HH-10 DISEASE MANAGEMENT      HH agency  [facility]   Status of service:  Completed, signed off Medicare Important Message given?   (If response is "NO", the following Medicare IM given date fields will be blank) Date Medicare IM given:   Date Additional Medicare IM given:    Discharge Disposition:  HOME W HOME HEALTH SERVICES  Per UR Regulation:  Reviewed for med. necessity/level of care/duration of stay  If discussed at Long Length of Stay Meetings, dates discussed:    Comments:  11-01-12 Lorenda Ishihara RN CM 1200 Spoke with patient at bedside, fiance present. Plans to d/c home with fiance. She will provide transportation but unable to transport the patient daily. Will arrange [facility] to draw INR and call to [facility] clinic in [place]. Once patient able to get to [facility] clinic they will take over monitoring INR. Patient agreeable with plan. Contacted [place] and they have noted he will need f/u on Coumadin adjustments so phsycian is aware. Patient no longer requires Lovenox, plans for d/c on Coumadin only. Patient states can afford this out of pocket.  10/30/12 KATHY MAHABIR RN,BSN NCM [phone] SPOKE TO PATIENT ABOUT HEALTH [employer].[name],[name].

## 2012-10-30 NOTE — Progress Notes (Addendum)
ANTICOAGULATION CONSULT NOTE - Initial Consult  Pharmacy Consult for Lovenox Indication: Bilateral DVT  Allergies  Allergen Reactions  . Lisinopril Swelling  . Tramadol Nausea And Vomiting    Patient Measurements: Height: 6\' 1"  (185.4 cm) Weight: 210 lb (95.255 kg) IBW/kg (Calculated) : 79.9 Heparin Dosing Weight: 95kg  Vital Signs: Temp: 97.6 F (36.4 C) (02/15 0547) Temp src: Oral (02/15 0547) BP: 105/68 mmHg (02/15 0547) Pulse Rate: 77 (02/15 0547)  Labs:  Recent Labs  10/27/12 1145  10/28/12 0410 10/29/12 0440 10/29/12 1314 10/29/12 1937 10/30/12 0341  HGB 15.5  --  14.4 14.8  --   --  14.2  HCT 46.9  --  45.3 45.7  --   --  43.9  PLT 268  --  261 246  --   --  208  APTT 36  --   --   --   --   --   --   LABPROT 12.0  --  13.1 13.3  --   --  13.8  INR 0.89  --  1.00 1.02  --   --  1.07  HEPARINUNFRC  --   < > 0.65 0.92* 0.35 0.27* 0.21*  CREATININE 1.27  --   --  1.49*  --   --  1.36*  < > = values in this interval not displayed.  Estimated Creatinine Clearance: 67.7 ml/min (by C-G formula based on Cr of 1.36).   Medical History: Past Medical History  Diagnosis Date  . Hypertension   . Back pain   . DVT (deep venous thrombosis)     left leg  . Knee joint pain     Medications:  Scheduled:  . amoxicillin-clavulanate  1 tablet Oral BID  . enoxaparin (LOVENOX) injection  150 mg Subcutaneous Q24H  . [COMPLETED] patient's guide to using coumadin book   Does not apply Once  . [COMPLETED] warfarin  12.5 mg Oral ONCE-1800  . [COMPLETED] warfarin   Does not apply Once  . Warfarin - Pharmacist Dosing Inpatient   Does not apply q1800  . [DISCONTINUED] azithromycin  500 mg Intravenous Q24H  . [DISCONTINUED] cefTRIAXone (ROCEPHIN)  IV  1 g Intravenous Q24H   Infusions:  . sodium chloride 100 mL/hr (10/30/12 0634)  . [DISCONTINUED] heparin 1,250 Units/hr (10/29/12 1047)  . [DISCONTINUED] heparin 1,450 Units/hr (10/29/12 2044)  . [DISCONTINUED] heparin  1,650 Units/hr (10/30/12 1610)   PRN: acetaminophen, acetaminophen, albuterol, HYDROcodone-acetaminophen, tetrahydrozoline  Assessment: 57 yo M has been receiving Heparin infusion since 2/11. Now to change to Lovenox (and Coumadin) for bilateral DVT.  Overlap day4/5  Protime slow to rise. Will receive 4th dose of Coumadin today. 15mg  to be given at 1300. Bridging Coumadin/Lovenox Goal of Therapy:  Heparin level (LMWH)=0.6-1.2 Monitor platelets by anticoagulation protocol: Yes   Plan:  Lovenox 150mg  sq q 24 hours to start 1 hour after heparin infusion is D/Cd Coumadin 15mg  po x 1 today. Follow up labs in AM  Loletta Specter 10/30/2012,11:43 AM

## 2012-10-30 NOTE — Progress Notes (Signed)
[facility] PROGRESS NOTE  Malik Mullins ZOX:[id number] DOB: [birth year] DOA: 10/27/2012 PCP: Provider Not In System  Assessment/Plan:  #1 bilateral DVT On IV heparin bridge with Coumadin. Hypercoagulable panel is pending. INR 1.07. Patient stated that prior history of DVT in the 80s. As this is patient's second DVT will likely need to be on anti-coagulation for life. Will change IV heparin to full dose lovenox. Follow.  #2 abnormal chest x-ray/ ?? prob community acquired pneumonia Patient currently afebrile. Patient is asymptomatic. Change  IV Rocephin and azithromycin to oral augmentin.  #3 hypertension Blood pressure is currently stable. Will follow.  #4 osteoarthritis of bilateral knees Continue pain management.  #5 prophylaxis On full dose heparin for DVT prophylaxis.   Code Status: Full Family Communication: updated patient no family at bedside Disposition Plan: Home when medically stable.   Consultants:  None  Procedures:  LE Dopplers 10/27/12  CXR 10/27/12/  Antibiotics:  IV Rocephin 10/28/12--->10/30/12  IV Azithromycin 10/28/12---> 10/30/12  Oral Augmentin 10/30/12  HPI/Subjective: Patient states he is feeling better. No complaints.  Objective: Filed Vitals:   10/29/12 0459 10/29/12 1400 10/29/12 2125 10/30/12 0547  BP: 106/69 100/54 110/73 105/68  Pulse: 67 71 74 77  Temp: 97.7 F (36.5 C) 98.4 F (36.9 C) 97.9 F (36.6 C) 97.6 F (36.4 C)  TempSrc: Oral Oral Oral Oral  Resp: 18 18 18 18   Height:      Weight:      SpO2: 96% 99% 96% 97%    Intake/Output Summary (Last 24 hours) at 10/30/12 1102 Last data filed at 10/30/12 0547  Gross per 24 hour  Intake   2030 ml  Output   2750 ml  Net   -720 ml   Filed Weights   10/27/12 1454  Weight: 95.255 kg (210 lb)    Exam:   General:  NAD  Cardiovascular: RRR. No LE edema  Respiratory: CTAB  Abdomen: Soft/NT/ND/+BS  Data Reviewed: Basic Metabolic Panel:  Recent Labs Lab  10/27/12 1145 10/29/12 0440 10/30/12 0341  NA 137 138 138  K 4.4 4.2 4.0  CL 101 100 104  CO2 28 29 26   GLUCOSE 110* 100* 101*  BUN 19 17 17   CREATININE 1.27 1.49* 1.36*  CALCIUM 9.3 9.2 8.8   Liver Function Tests: No results found for this basename: AST, ALT, ALKPHOS, BILITOT, PROT, ALBUMIN,  in the last 168 hours No results found for this basename: LIPASE, AMYLASE,  in the last 168 hours No results found for this basename: AMMONIA,  in the last 168 hours CBC:  Recent Labs Lab 10/27/12 1145 10/28/12 0410 10/29/12 0440 10/30/12 0341  WBC 7.3 6.0 4.3 4.3  NEUTROABS 4.3  --   --   --   HGB 15.5 14.4 14.8 14.2  HCT 46.9 45.3 45.7 43.9  MCV 82.7 83.3 82.6 83.0  PLT 268 261 246 208   Cardiac Enzymes: No results found for this basename: CKTOTAL, CKMB, CKMBINDEX, TROPONINI,  in the last 168 hours BNP (last 3 results) No results found for this basename: PROBNP,  in the last 8760 hours CBG: No results found for this basename: GLUCAP,  in the last 168 hours  No results found for this or any previous visit (from the past 240 hour(s)).   Studies: No results found.  Scheduled Meds: . azithromycin  500 mg Intravenous Q24H  . cefTRIAXone (ROCEPHIN)  IV  1 g Intravenous Q24H  . Warfarin - Pharmacist Dosing Inpatient   Does not apply [phone]  Continuous Infusions: . sodium chloride 100 mL/hr (10/30/12 0634)  . heparin 1,650 Units/hr (10/30/12 1610)    Principal Problem:   DVT of lower extremity, bilateral Active Problems:   Hypertension   Osteoarthritis of both knees   CAP (community acquired pneumonia)    Time spent: > 35 mins    [facility]  [facility] Pager [phone]. If 8PM-8AM, please contact night-coverage at [email or web address], password [facility] 10/30/2012, 11:02 AM  LOS: 3 days

## 2012-10-31 LAB — BASIC METABOLIC PANEL
CO2: 25 mEq/L (ref 19–32)
Calcium: 9.2 mg/dL (ref 8.4–10.5)
Chloride: 104 mEq/L (ref 96–112)
Creatinine, Ser: 1.33 mg/dL (ref 0.50–1.35)
Glucose, Bld: 96 mg/dL (ref 70–99)

## 2012-10-31 LAB — CBC
HCT: 45 % (ref 39.0–52.0)
Hemoglobin: 14.5 g/dL (ref 13.0–17.0)
MCH: 26.8 pg (ref 26.0–34.0)
MCV: 83.2 fL (ref 78.0–100.0)
Platelets: 218 10*3/uL (ref 150–400)
RBC: 5.41 MIL/uL (ref 4.22–5.81)
WBC: 3.2 10*3/uL — ABNORMAL LOW (ref 4.0–10.5)

## 2012-10-31 MED ORDER — WARFARIN SODIUM 7.5 MG PO TABS
15.0000 mg | ORAL_TABLET | Freq: Once | ORAL | Status: AC
  Administered 2012-10-31: 15 mg via ORAL
  Filled 2012-10-31: qty 2

## 2012-10-31 MED ORDER — ENOXAPARIN (LOVENOX) PATIENT EDUCATION KIT
PACK | Freq: Once | Status: AC
  Administered 2012-10-31: 13:00:00
  Filled 2012-10-31: qty 1

## 2012-10-31 NOTE — Progress Notes (Signed)
ANTICOAGULATION CONSULT NOTE - Follow Up Consult  Pharmacy Consult for Lovenox and Coumadin Indication: bilateral DVT  Allergies  Allergen Reactions  . Lisinopril Swelling  . Tramadol Nausea And Vomiting   Patient Measurements: Height: 6\' 1"  (185.4 cm) Weight: 210 lb (95.255 kg) IBW/kg (Calculated) : 79.9  Vital Signs: Temp: 97.8 F (36.6 C) (02/16 0500) Temp src: Oral (02/16 0500) BP: 109/71 mmHg (02/16 0500) Pulse Rate: 65 (02/16 0500)  Labs:  Recent Labs  10/29/12 0440  10/29/12 1937 10/30/12 0341 10/30/12 1114 10/31/12 0415  HGB 14.8  --   --  14.2  --  14.5  HCT 45.7  --   --  43.9  --  45.0  PLT 246  --   --  208  --  218  LABPROT 13.3  --   --  13.8  --  17.5*  INR 1.02  --   --  1.07  --  1.48  HEPARINUNFRC 0.92*  < > 0.27* 0.21* 0.28*  --   CREATININE 1.49*  --   --  1.36*  --  1.33  < > = values in this interval not displayed.  Estimated Creatinine Clearance: 69.3 ml/min (by C-G formula based on Cr of 1.33).  Medications:  Scheduled:  . amoxicillin-clavulanate  1 tablet Oral BID  . enoxaparin (LOVENOX) injection  150 mg Subcutaneous Q24H  . [COMPLETED] warfarin  15 mg Oral Once  . Warfarin - Pharmacist Dosing Inpatient   Does not apply q1800  . [DISCONTINUED] azithromycin  500 mg Intravenous Q24H  . [DISCONTINUED] cefTRIAXone (ROCEPHIN)  IV  1 g Intravenous Q24H    Assessment: Bilateral DVT by doppler: IV Heparin and Warfarin from 2/12; changed to Lovenox 2/15. Hx of DVT in 1980's. Noted pt has no insurance; care received at [facility] clinic.  D5/5 minimum overlap for VTE, INR not yet therapeutic  INR = 1.48 after 10,10,12.5, 15mg  Coumadin  Lovenox 150mg  q24 from 2/15  Goal of Therapy:  INR 2-3   Plan:  Continue Lovenox; require 24 hr of INR > 2 while on Lovenox Warfarin 15mg  again today Daily Protime/INR  Otho Bellows PharmD Pager [phone] 10/31/2012,9:33 AM

## 2012-10-31 NOTE — Progress Notes (Signed)
[facility] PROGRESS NOTE  Malik Mullins YQM:[id number] DOB: [birth year] DOA: 10/27/2012 PCP: Provider Not In System  Assessment/Plan:  #1 bilateral DVT On Lovenox bridge with Coumadin. Hypercoagulable panel is pending. INR 1.48. Patient stated that prior history of DVT in the 80s. As this is patient's second DVT will likely need to be on anti-coagulation for life. Continue her full dose Lovenox bridge with Coumadin.  Follow.  #2 abnormal chest x-ray/ ?? prob community acquired pneumonia Patient currently afebrile. Patient is asymptomatic. Continue oral augmentin.  #3 hypertension Blood pressure is currently stable. Will follow.  #4 osteoarthritis of bilateral knees Continue pain management.  #5 prophylaxis On full dose Lovenox for DVT prophylaxis.   Code Status: Full Family Communication: updated patient no family at bedside Disposition Plan: Home when medically stable.   Consultants:  None  Procedures:  LE Dopplers 10/27/12  CXR 10/27/12/  Antibiotics:  IV Rocephin 10/28/12--->10/30/12  IV Azithromycin 10/28/12---> 10/30/12  Oral Augmentin 10/30/12  HPI/Subjective: Patient states he is feeling better. No complaints.  Objective: Filed Vitals:   10/30/12 0547 10/30/12 1400 10/30/12 2140 10/31/12 0500  BP: 105/68 126/78 119/78 109/71  Pulse: 77 79 76 65  Temp: 97.6 F (36.4 C) 97.9 F (36.6 C) 97.9 F (36.6 C) 97.8 F (36.6 C)  TempSrc: Oral Oral Oral Oral  Resp: 18 18 18 18   Height:      Weight:      SpO2: 97% 99% 98% 99%    Intake/Output Summary (Last 24 hours) at 10/31/12 1220 Last data filed at 10/31/12 1000  Gross per 24 hour  Intake   2480 ml  Output   2925 ml  Net   -445 ml   Filed Weights   10/27/12 1454  Weight: 95.255 kg (210 lb)    Exam:   General:  NAD  Cardiovascular: RRR. No LE edema  Respiratory: CTAB  Abdomen: Soft/NT/ND/+BS  Data Reviewed: Basic Metabolic Panel:  Recent Labs Lab 10/27/12 1145 10/29/12 0440  10/30/12 0341 10/31/12 0415  NA 137 138 138 139  K 4.4 4.2 4.0 4.2  CL 101 100 104 104  CO2 28 29 26 25   GLUCOSE 110* 100* 101* 96  BUN 19 17 17 12   CREATININE 1.27 1.49* 1.36* 1.33  CALCIUM 9.3 9.2 8.8 9.2   Liver Function Tests: No results found for this basename: AST, ALT, ALKPHOS, BILITOT, PROT, ALBUMIN,  in the last 168 hours No results found for this basename: LIPASE, AMYLASE,  in the last 168 hours No results found for this basename: AMMONIA,  in the last 168 hours CBC:  Recent Labs Lab 10/27/12 1145 10/28/12 0410 10/29/12 0440 10/30/12 0341 10/31/12 0415  WBC 7.3 6.0 4.3 4.3 3.2*  NEUTROABS 4.3  --   --   --   --   HGB 15.5 14.4 14.8 14.2 14.5  HCT 46.9 45.3 45.7 43.9 45.0  MCV 82.7 83.3 82.6 83.0 83.2  PLT 268 261 246 208 218   Cardiac Enzymes: No results found for this basename: CKTOTAL, CKMB, CKMBINDEX, TROPONINI,  in the last 168 hours BNP (last 3 results) No results found for this basename: PROBNP,  in the last 8760 hours CBG: No results found for this basename: GLUCAP,  in the last 168 hours  No results found for this or any previous visit (from the past 240 hour(s)).   Studies: No results found.  Scheduled Meds: . amoxicillin-clavulanate  1 tablet Oral BID  . enoxaparin (LOVENOX) injection  150 mg Subcutaneous Q24H  .  Warfarin - Pharmacist Dosing Inpatient   Does not apply q1800   Continuous Infusions: . sodium chloride 100 mL/hr at 10/31/12 0247    Principal Problem:   DVT of lower extremity, bilateral Active Problems:   Hypertension   Osteoarthritis of both knees   CAP (community acquired pneumonia)    Time spent: > 35 mins    [facility]  [facility] Pager [phone]. If 8PM-8AM, please contact night-coverage at [email or web address], password [facility] 10/31/2012, 12:20 PM  LOS: 4 days

## 2012-10-31 NOTE — Progress Notes (Signed)
Instructed pt in how to access the Lovenox education channel.  Gave him the Lovenox teaching kit,  went over the info book, and showed him how to use the sharps box.  Pt stated that his fiance would be giving him his Lovenox shot because she was a diabetic and knew how to do so. Will pass on to nursing that this info needs be reinforced to them   discharge.

## 2012-11-01 LAB — BASIC METABOLIC PANEL
Calcium: 9.3 mg/dL (ref 8.4–10.5)
GFR calc Af Amer: 61 mL/min — ABNORMAL LOW (ref >90)
GFR calc non Af Amer: 53 mL/min — ABNORMAL LOW (ref >90)
Glucose, Bld: 98 mg/dL (ref 70–99)
Potassium: 4.2 mEq/L (ref 3.5–5.1)
Sodium: 137 mEq/L (ref 135–145)

## 2012-11-01 LAB — PROTIME-INR: Prothrombin Time: 21.9 seconds — ABNORMAL HIGH (ref 11.6–15.2)

## 2012-11-01 LAB — CBC
Hemoglobin: 14.1 g/dL (ref 13.0–17.0)
MCH: 26.5 pg (ref 26.0–34.0)
MCHC: 31.7 g/dL (ref 30.0–36.0)
Platelets: 235 10*3/uL (ref 150–400)
RDW: 13.3 % (ref 11.5–15.5)

## 2012-11-01 MED ORDER — WARFARIN SODIUM 5 MG PO TABS: 7.5000 mg | ORAL_TABLET | Freq: Once | ORAL | Status: DC

## 2012-11-01 MED ORDER — PNEUMOCOCCAL VAC POLYVALENT 25 MCG/0.5ML IJ INJ
0.5000 mL | INJECTION | Freq: Once | INTRAMUSCULAR | Status: AC
  Administered 2012-11-01: 0.5 mL via INTRAMUSCULAR
  Filled 2012-11-01: qty 0.5

## 2012-11-01 MED ORDER — HYDROCODONE-ACETAMINOPHEN 5-325 MG PO TABS: 1.0000 | ORAL_TABLET | ORAL | Status: DC | PRN

## 2012-11-01 MED ORDER — WARFARIN SODIUM 2.5 MG PO TABS
2.5000 mg | ORAL_TABLET | Freq: Once | ORAL | Status: AC
  Administered 2012-11-01: 2.5 mg via ORAL
  Filled 2012-11-01: qty 1

## 2012-11-01 NOTE — Discharge Summary (Signed)
Physician Discharge Summary  Malik Mullins WUJ:[id number] DOB: [birth year] DOA: 10/27/2012  PCP: Provider Not In System  Admit date: 10/27/2012 Discharge date: 11/01/2012  Time spent: 60 minutes  Recommendations for Outpatient Follow-up:  1. Patient is to get a PT/INR checked tomorrow 11/02/2012 and results called in to patient's PCP who will manage his Coumadin. 2. Patient is to followup with PCP one week post discharge, CBC and BMET will need to be done on follow up. Hypercoagulable panel will need to be followed up upon.  Discharge Diagnoses:  Principal Problem:   DVT of lower extremity, bilateral Active Problems:   Hypertension   Osteoarthritis of both knees   CAP (community acquired pneumonia)   Discharge Condition: Stable and improved  Diet recommendation: Regular  Filed Weights   10/27/12 1454  Weight: 95.255 kg (210 lb)    History of present illness:  57 year old male with history off hypertension, left leg DVT in the 80s and treated, history of arthritis of the knee presented to the ED with worsening pain over bilateral knees and right lower thigh worsened since past 2-3 days. Patient denies any history of trauma, fever or chills. The pain however but significantly worsened not improved with over-the-counter NSAIDs and impaired with his ambulation. In the ED his vitals were stable. However Doppler of his lower extremity done showed bilateral DVT. Patient denies any recent surgery or travel. Informs being ambulatory despite his knee pains and walks almost on 2 miles every day. He is an active smoker and is is currently smoking 3-4 cigarettes daily. Denies any alcohol use. He denies any chest pain, palpitations, shortness of breath, cough, hemoptysis, abdominal pain, nausea, vomiting, bowel or urinary symptoms. Denies any change in weight or appetite. He does not have insurance at this time and follows with [facility] clinic approximately once a year and gets medications for  his blood pressure to the mail. Denies history of blood clots in family.      Hospital Course:  #1 bilateral DVT  On presentation to the ED lower extremity Dopplers were done which showed bilateral DVTs. Patient had denied any recent surgeries. Patient had been ambulatory. Patient was admitted and placed empirically on IV heparin. Coumadin was subsequently started. Hypercoagulable panel was ordered and was pending at time of discharge. Patient was monitored patient improved clinically he was subsequently changed from IV heparin to Lovenox bridge with Coumadin. Patient had overlap x6 days. INR on day of discharge was 2.00. Patient did state that he had a prior history of DVT in the 22s. Patient will likely need to be on anti-coagulation indefinitely. Patient will be discharged home in stable condition. An INR will be checked by home health nurse tomorrow 11/02/2012, and results called into patient's PCP at the [facility], who will be managing patient's Coumadin. Patient will be discharged in stable and improved condition.  #2 abnormal chest x-ray/ ?? prob community acquired pneumonia  On admission patient was noted to have an abnormal chest x-ray worrisome for probable community-acquired pneumonia. Patient initially was empirically started on IV Rocephin and azithromycin and followed. Patient remained asymptomatic did not have any upper respiratory symptoms. Patient denied any cough. Patient remained afebrile. Patient was subsequently transitioned to oral Augmentin and was treated for total of 5 days of antibiotic therapy. Patient will be discharged in stable and improved condition. #3 hypertension  Blood pressure remained stable. #4 osteoarthritis of bilateral knees  Patient was noted to have osteoarthritis of his bilateral knees. Patient's pain was managed during the  hospitalization.  The rest of patient's chronic medical issues remained stable throughout the hospitalization and patient be discharged in  stable and improved condition.      Procedures: LE Dopplers 10/27/12  CXR 10/27/12/     Consultations:  None  Discharge Exam: Filed Vitals:   10/31/12 0500 10/31/12 1400 10/31/12 2115 11/01/12 0500  BP: 109/71 115/74 112/62 102/69  Pulse: 65 68 76 70  Temp: 97.8 F (36.6 C) 98 F (36.7 C) 97.8 F (36.6 C) 97.7 F (36.5 C)  TempSrc: Oral Oral Oral Oral  Resp: 18 18 18 18   Height:      Weight:      SpO2: 99% 99% 100% 99%    General: NAD Cardiovascular: RRR Respiratory: CTAB  Discharge Instructions  Discharge Orders   Future Orders Complete By Expires     Diet general  As directed     Discharge instructions  As directed     Comments:      Follow up with PCP in 1 week. RN to do bloodwork tomorrow and results will be called in to PCP. Will need to hear back from PCP for coumadin doseages.    Increase activity slowly  As directed         Medication List    TAKE these medications       albuterol 108 (90 BASE) MCG/ACT inhaler  Commonly known as:  PROVENTIL HFA;VENTOLIN HFA  Inhale 2 puffs into the lungs every 6 (six) hours as needed. For shortness of breath     HYDROcodone-acetaminophen 5-325 MG per tablet  Commonly known as:  NORCO/VICODIN  Take 1-2 tablets by mouth every 4 (four) hours as needed.     multivitamin with minerals Tabs  Take 1 tablet by mouth every morning.     PRESCRIPTION MEDICATION  Take 1 tablet by mouth every morning. Blood pressure medication.     tetrahydrozoline 0.05 % ophthalmic solution  Place 2 drops into both eyes 4 (four) times daily as needed (For eye irritation.).     warfarin 5 MG tablet  Commonly known as:  COUMADIN  Take 1.5 tablets (7.5 mg total) by mouth one time only at 6 PM.  Start taking on:  11/02/2012           Follow-up Information   Follow up with Dr. Lynnae Prude. Schedule an appointment as soon as possible for a visit in 1 week. (Followup with PCP one week.)    Contact information:    [address] [place]  [phone]      Follow up with [facility] On 11/02/2012. (RN to draw INR and call to PCP)    Contact information:   [address] [place] [address] [phone]       The results of significant diagnostics from this hospitalization (including imaging, microbiology, ancillary and laboratory) are listed below for reference.    Significant Diagnostic Studies: Dg Chest 2 View  10/27/2012  *RADIOLOGY REPORT*  Clinical Data: Cough and shortness of breath  CHEST - 2 VIEW  Comparison: 06/01/2009  Findings: Heart size is normal.  Mediastinal shadows are normal. There is patchy infiltrate in the left lower lobe consistent with pneumonia.  No heart failure or effusions.  Bony structures are unremarkable.  Chronic degenerative changes effect the shoulders.  IMPRESSION: Patchy infiltrate in the left lower lobe consistent with pneumonia.   Original Report Authenticated By: Paulina Fusi, M.D.    Dg Knee Complete 4 Views Right  10/27/2012  *RADIOLOGY REPORT*  Clinical Data: Knee pain.  Joint swelling  RIGHT KNEE - COMPLETE 4+ VIEW  Comparison: None  Findings: No joint effusion identified.  There is no fracture or subluxation identified.  There is no radio-opaque foreign body or soft tissue calcification.  Sharpening tibial spines and marginal spur formation is noted consistent with degenerative joint disease.  IMPRESSION:  1.  Osteoarthritis. 2.  No acute findings.   Original Report Authenticated By: Signa Kell, M.D.     Microbiology: No results found for this or any previous visit (from the past 240 hour(s)).   Labs: Basic Metabolic Panel:  Recent Labs Lab 10/27/12 1145 10/29/12 0440 10/30/12 0341 10/31/12 0415 11/01/12 0402  NA 137 138 138 139 137  K 4.4 4.2 4.0 4.2 4.2  CL 101 100 104 104 104  CO2 28 29 26 25 26   GLUCOSE 110* 100* 101* 96 98  BUN 19 17 17 12 13   CREATININE 1.27 1.49* 1.36* 1.33 1.43*  CALCIUM 9.3 9.2 8.8 9.2 9.3   Liver Function  Tests: No results found for this basename: AST, ALT, ALKPHOS, BILITOT, PROT, ALBUMIN,  in the last 168 hours No results found for this basename: LIPASE, AMYLASE,  in the last 168 hours No results found for this basename: AMMONIA,  in the last 168 hours CBC:  Recent Labs Lab 10/27/12 1145 10/28/12 0410 10/29/12 0440 10/30/12 0341 10/31/12 0415 11/01/12 0402  WBC 7.3 6.0 4.3 4.3 3.2* 3.6*  NEUTROABS 4.3  --   --   --   --   --   HGB 15.5 14.4 14.8 14.2 14.5 14.1  HCT 46.9 45.3 45.7 43.9 45.0 44.5  MCV 82.7 83.3 82.6 83.0 83.2 83.6  PLT 268 261 246 208 218 235   Cardiac Enzymes: No results found for this basename: CKTOTAL, CKMB, CKMBINDEX, TROPONINI,  in the last 168 hours BNP: BNP (last 3 results) No results found for this basename: PROBNP,  in the last 8760 hours CBG: No results found for this basename: GLUCAP,  in the last 168 hours     Signed:  Setsuko Robins  [facility] 11/01/2012, 12:13 PM

## 2012-11-01 NOTE — Progress Notes (Signed)
ANTICOAGULATION CONSULT NOTE - Follow Up Consult  Pharmacy Consult for Lovenox and Coumadin Indication: bilateral DVT  Allergies  Allergen Reactions  . Lisinopril Swelling  . Tramadol Nausea And Vomiting   Patient Measurements: Height: 6\' 1"  (185.4 cm) Weight: 210 lb (95.255 kg) IBW/kg (Calculated) : 79.9  Vital Signs: Temp: 97.7 F (36.5 C) (02/17 0500) Temp src: Oral (02/17 0500) BP: 102/69 mmHg (02/17 0500) Pulse Rate: 70 (02/17 0500)  Labs:  Recent Labs  10/29/12 1937  10/30/12 0341 10/30/12 1114 10/31/12 0415 11/01/12 0402  HGB  --   < > 14.2  --  14.5 14.1  HCT  --   --  43.9  --  45.0 44.5  PLT  --   --  208  --  218 235  LABPROT  --   --  13.8  --  17.5* 21.9*  INR  --   --  1.07  --  1.48 2.00*  HEPARINUNFRC 0.27*  --  0.21* 0.28*  --   --   CREATININE  --   --  1.36*  --  1.33 1.43*  < > = values in this interval not displayed.  Estimated Creatinine Clearance: 64.4 ml/min (by C-G formula based on Cr of 1.43).  Medications:  Scheduled:  . amoxicillin-clavulanate  1 tablet Oral BID  . enoxaparin (LOVENOX) injection  150 mg Subcutaneous Q24H  . [COMPLETED] enoxaparin   Does not apply Once  . [COMPLETED] warfarin  15 mg Oral Once  . Warfarin - Pharmacist Dosing Inpatient   Does not apply q1800    Assessment: Bilateral DVT by doppler: IV Heparin and Warfarin from 2/12; changed to Lovenox 2/15. Hx of DVT in 1980's. Noted pt has no insurance; care received at [facility] clinic.  D6 bridge overlap for VTE, INR rose quickly over last 2 days, now therapeutic.  I expect INR to continue to rise from effects of large boosted doses.  Will dose conservatively tonight.   INR = 2.0 after 10,10,12.5, 15, 15mg  Coumadin  Lovenox 150mg  q24 from 2/15  CBC ok, no bleed noted  Goal of Therapy:  INR 2-3   Plan:  D/C lovenox Warfarin 2.5 mg po x 1 tonight Daily Protime/INR  Haynes Hoehn E PharmD Pager [phone] 11/01/2012,9:38 AM

## 2012-11-01 NOTE — Progress Notes (Signed)
Pt for d/c to home today. [facility] to draw labs for PT/INR & results relayed to [facility] to manage Coumadin. Coumadin dose given today as ordered. IV d/c'd. D/C instructions & Rx given with verbalized understanding. Significant other(girlfriend) at bedside to assist with d/c

## 2013-03-03 ENCOUNTER — Emergency Department (HOSPITAL_COMMUNITY): Payer: Medicaid Other

## 2013-03-03 ENCOUNTER — Encounter (HOSPITAL_COMMUNITY): Payer: Self-pay | Admitting: Nurse Practitioner

## 2013-03-03 ENCOUNTER — Inpatient Hospital Stay (HOSPITAL_COMMUNITY)
Admission: EM | Admit: 2013-03-03 | Discharge: 2013-03-05 | DRG: 683 | Disposition: A | Discharge status: Home / Self Care | Payer: Medicaid Other | Attending: Internal Medicine | Admitting: Internal Medicine

## 2013-03-03 DIAGNOSIS — I10 Essential (primary) hypertension: Secondary | ICD-10-CM | POA: Diagnosis present

## 2013-03-03 DIAGNOSIS — R55 Syncope and collapse: Secondary | ICD-10-CM

## 2013-03-03 DIAGNOSIS — F17200 Nicotine dependence, unspecified, uncomplicated: Secondary | ICD-10-CM | POA: Diagnosis present

## 2013-03-03 DIAGNOSIS — I441 Atrioventricular block, second degree: Secondary | ICD-10-CM | POA: Diagnosis present

## 2013-03-03 DIAGNOSIS — N179 Acute kidney failure, unspecified: Principal | ICD-10-CM | POA: Diagnosis present

## 2013-03-03 DIAGNOSIS — R0609 Other forms of dyspnea: Secondary | ICD-10-CM | POA: Diagnosis present

## 2013-03-03 DIAGNOSIS — I440 Atrioventricular block, first degree: Secondary | ICD-10-CM | POA: Diagnosis present

## 2013-03-03 DIAGNOSIS — I959 Hypotension, unspecified: Secondary | ICD-10-CM | POA: Diagnosis present

## 2013-03-03 DIAGNOSIS — R0989 Other specified symptoms and signs involving the circulatory and respiratory systems: Secondary | ICD-10-CM | POA: Diagnosis present

## 2013-03-03 DIAGNOSIS — F1010 Alcohol abuse, uncomplicated: Secondary | ICD-10-CM | POA: Diagnosis present

## 2013-03-03 DIAGNOSIS — N189 Chronic kidney disease, unspecified: Secondary | ICD-10-CM | POA: Diagnosis present

## 2013-03-03 DIAGNOSIS — T675XXA Heat exhaustion, unspecified, initial encounter: Secondary | ICD-10-CM

## 2013-03-03 DIAGNOSIS — I82403 Acute embolism and thrombosis of unspecified deep veins of lower extremity, bilateral: Secondary | ICD-10-CM

## 2013-03-03 DIAGNOSIS — I129 Hypertensive chronic kidney disease with stage 1 through stage 4 chronic kidney disease, or unspecified chronic kidney disease: Secondary | ICD-10-CM | POA: Diagnosis present

## 2013-03-03 DIAGNOSIS — E860 Dehydration: Secondary | ICD-10-CM | POA: Diagnosis present

## 2013-03-03 DIAGNOSIS — I442 Atrioventricular block, complete: Secondary | ICD-10-CM | POA: Diagnosis present

## 2013-03-03 DIAGNOSIS — Z86718 Personal history of other venous thrombosis and embolism: Secondary | ICD-10-CM

## 2013-03-03 HISTORY — DX: Pneumonia, unspecified organism: J18.9

## 2013-03-03 LAB — COMPREHENSIVE METABOLIC PANEL
ALT: 20 U/L (ref 0–53)
Albumin: 3.7 g/dL (ref 3.5–5.2)
Alkaline Phosphatase: 99 U/L (ref 39–117)
BUN: 40 mg/dL — ABNORMAL HIGH (ref 6–23)
Chloride: 98 mEq/L (ref 96–112)
GFR calc Af Amer: 20 mL/min — ABNORMAL LOW (ref >90)
Glucose, Bld: 92 mg/dL (ref 70–99)
Potassium: 3.5 mEq/L (ref 3.5–5.1)
Sodium: 134 mEq/L — ABNORMAL LOW (ref 135–145)
Total Bilirubin: 0.6 mg/dL (ref 0.3–1.2)
Total Protein: 7.7 g/dL (ref 6.0–8.3)

## 2013-03-03 LAB — CBC WITH DIFFERENTIAL/PLATELET
Eosinophils Absolute: 0.1 10*3/uL (ref 0.0–0.7)
Hemoglobin: 14.9 g/dL (ref 13.0–17.0)
Lymphs Abs: 1.4 10*3/uL (ref 0.7–4.0)
Monocytes Relative: 8 % (ref 3–12)
Neutro Abs: 7.2 10*3/uL (ref 1.7–7.7)
Neutrophils Relative %: 76 % (ref 43–77)
Platelets: 235 10*3/uL (ref 150–400)
RBC: 5.52 MIL/uL (ref 4.22–5.81)
WBC: 9.5 10*3/uL (ref 4.0–10.5)

## 2013-03-03 LAB — PROTIME-INR: Prothrombin Time: 21 seconds — ABNORMAL HIGH (ref 11.6–15.2)

## 2013-03-03 LAB — TROPONIN I: Troponin I: <0.3 ng/mL (ref <0.30)

## 2013-03-03 LAB — CK: Total CK: 967 U/L — ABNORMAL HIGH (ref 7–232)

## 2013-03-03 LAB — TSH: TSH: 1.121 u[IU]/mL (ref 0.350–4.500)

## 2013-03-03 MED ORDER — MORPHINE SULFATE 2 MG/ML IJ SOLN
1.0000 mg | INTRAMUSCULAR | Status: DC | PRN
  Administered 2013-03-03: 1 mg via INTRAVENOUS
  Filled 2013-03-03: qty 1

## 2013-03-03 MED ORDER — MORPHINE SULFATE 4 MG/ML IJ SOLN
4.0000 mg | Freq: Once | INTRAMUSCULAR | Status: AC
  Administered 2013-03-03: 4 mg via INTRAVENOUS
  Filled 2013-03-03: qty 1

## 2013-03-03 MED ORDER — VITAMIN B-1 100 MG PO TABS
100.0000 mg | ORAL_TABLET | Freq: Every day | ORAL | Status: DC
  Administered 2013-03-03 – 2013-03-05 (×3): 100 mg via ORAL
  Filled 2013-03-03 (×3): qty 1

## 2013-03-03 MED ORDER — FOLIC ACID 1 MG PO TABS
1.0000 mg | ORAL_TABLET | Freq: Every day | ORAL | Status: DC
  Administered 2013-03-03 – 2013-03-05 (×3): 1 mg via ORAL
  Filled 2013-03-03 (×3): qty 1

## 2013-03-03 MED ORDER — SODIUM CHLORIDE 0.9 % IJ SOLN
3.0000 mL | Freq: Two times a day (BID) | INTRAMUSCULAR | Status: DC
  Administered 2013-03-04 (×2): 3 mL via INTRAVENOUS

## 2013-03-03 MED ORDER — ADULT MULTIVITAMIN W/MINERALS CH
1.0000 | ORAL_TABLET | Freq: Every day | ORAL | Status: DC
  Administered 2013-03-03 – 2013-03-05 (×3): 1 via ORAL
  Filled 2013-03-03 (×3): qty 1

## 2013-03-03 MED ORDER — ONDANSETRON HCL 4 MG/2ML IJ SOLN: 4.0000 mg | Freq: Four times a day (QID) | INTRAMUSCULAR | Status: DC | PRN

## 2013-03-03 MED ORDER — SENNOSIDES-DOCUSATE SODIUM 8.6-50 MG PO TABS
1.0000 | ORAL_TABLET | Freq: Every evening | ORAL | Status: DC | PRN
  Filled 2013-03-03: qty 1

## 2013-03-03 MED ORDER — THIAMINE HCL 100 MG/ML IJ SOLN
100.0000 mg | Freq: Every day | INTRAMUSCULAR | Status: DC
  Filled 2013-03-03 (×2): qty 1

## 2013-03-03 MED ORDER — HEPARIN (PORCINE) IN NACL 100-0.45 UNIT/ML-% IJ SOLN
1600.0000 [IU]/h | INTRAMUSCULAR | Status: DC
  Administered 2013-03-04: 1600 [IU]/h via INTRAVENOUS
  Filled 2013-03-03: qty 250

## 2013-03-03 MED ORDER — NICOTINE 14 MG/24HR TD PT24
14.0000 mg | MEDICATED_PATCH | Freq: Every day | TRANSDERMAL | Status: DC
  Administered 2013-03-03 – 2013-03-05 (×3): 14 mg via TRANSDERMAL
  Filled 2013-03-03 (×3): qty 1

## 2013-03-03 MED ORDER — BACITRACIN ZINC 500 UNIT/GM EX OINT
1.0000 "application " | TOPICAL_OINTMENT | Freq: Once | CUTANEOUS | Status: AC
  Administered 2013-03-03: 1 via TOPICAL
  Filled 2013-03-03: qty 0.9

## 2013-03-03 MED ORDER — LORAZEPAM 1 MG PO TABS: 1.0000 mg | ORAL_TABLET | Freq: Four times a day (QID) | ORAL | Status: DC | PRN

## 2013-03-03 MED ORDER — SODIUM CHLORIDE 0.9 % IV SOLN
INTRAVENOUS | Status: DC
  Administered 2013-03-03: 1000 mL via INTRAVENOUS

## 2013-03-03 MED ORDER — ONDANSETRON HCL 4 MG PO TABS: 4.0000 mg | ORAL_TABLET | Freq: Four times a day (QID) | ORAL | Status: DC | PRN

## 2013-03-03 MED ORDER — SODIUM CHLORIDE 0.9 % IV SOLN
1000.0000 mL | Freq: Once | INTRAVENOUS | Status: AC
  Administered 2013-03-03: 1000 mL via INTRAVENOUS

## 2013-03-03 MED ORDER — LORAZEPAM 2 MG/ML IJ SOLN: 1.0000 mg | Freq: Four times a day (QID) | INTRAMUSCULAR | Status: DC | PRN

## 2013-03-03 MED ORDER — HYDROMORPHONE HCL PF 1 MG/ML IJ SOLN
1.0000 mg | INTRAMUSCULAR | Status: DC | PRN
  Administered 2013-03-03: 1 mg via INTRAVENOUS
  Administered 2013-03-04 (×2): 2 mg via INTRAVENOUS
  Filled 2013-03-03: qty 2
  Filled 2013-03-03: qty 1
  Filled 2013-03-03: qty 2

## 2013-03-03 MED ORDER — SODIUM CHLORIDE 0.9 % IV SOLN
1000.0000 mL | INTRAVENOUS | Status: DC
  Administered 2013-03-03 – 2013-03-04 (×2): 1000 mL via INTRAVENOUS

## 2013-03-03 NOTE — ED Provider Notes (Signed)
History   CSN: [id number] Arrival date & time 03/03/13  1337 First MD Initiated Contact with Patient 03/03/13 1348      Chief Complaint  Patient presents with  . Loss of Consciousness    HPI Patient presents to the emergency room after having a syncopal episode.  Patient states he was at work today. He was working outside as well as inside in a very hot building without air conditioning. Patient started to feel like he was getting overheated. He continued to work. The symptoms persisted. He started to feel like his vision was going away and then he had a fainting spell. Patient hit his chin when he passed out. He does think it might of chipped one of his teeth but he does not have any pain in his jaw. Patient states he started to feel better now although he does feel lightheaded still. Denies any headache or neck pain. Denies any chest pain or shortness of breath. He denies any abdominal pain. Denies any focal weakness. Past Medical History  Diagnosis Date  . Hypertension   . Back pain   . DVT (deep venous thrombosis)     left leg  . Knee joint pain     Past Surgical History  Procedure Laterality Date  . Mandible surgery      Family History  Problem Relation Age of Onset  . Hypertension Mother   . Diabetes Father     History  Substance Use Topics  . Smoking status: Current Some Day Smoker -- 0.30 packs/day    Types: Cigarettes  . Smokeless tobacco: Never Used  . Alcohol Use: Yes     Comment: "from time to time"      Review of Systems  All other systems reviewed and are negative.    Allergies  Lisinopril and Tramadol  Home Medications   Current Outpatient Rx  Name  Route  Sig  Dispense  Refill  . albuterol (PROVENTIL HFA;VENTOLIN HFA) 108 (90 BASE) MCG/ACT inhaler   Inhalation   Inhale 2 puffs into the lungs every 6 (six) hours as needed for wheezing or shortness of breath.          . lisinopril (PRINIVIL,ZESTRIL) 2.5 MG tablet   Oral   Take 2.5 mg by  mouth daily.         . Multiple Vitamin (MULTIVITAMIN WITH MINERALS) TABS   Oral   Take 1 tablet by mouth every morning.          . Rivaroxaban (XARELTO) 20 MG TABS   Oral   Take 20 mg by mouth daily.           BP 96/56  Pulse 81  Temp(Src) 97.7 F (36.5 C) (Oral)  Resp 20  SpO2 98%  Physical Exam  Nursing note and vitals reviewed. Constitutional: He appears well-developed and well-nourished. No distress.  HENT:  Head: Normocephalic and atraumatic.  Right Ear: External ear normal.  Left Ear: External ear normal.  Small superficial abrasion/laceration right anterior chin, no malocclusion, no obvious dental fracture, no oral bleeding  Eyes: Conjunctivae are normal. Right eye exhibits no discharge. Left eye exhibits no discharge. No scleral icterus.  Neck: Neck supple. No tracheal deviation present.  Cardiovascular: Normal rate, regular rhythm and intact distal pulses.   Pulmonary/Chest: Effort normal and breath sounds normal. No stridor. No respiratory distress. He has no wheezes. He has no rales.  Abdominal: Soft. Bowel sounds are normal. He exhibits no distension. There is no tenderness. There is no  rebound and no guarding.  Musculoskeletal: He exhibits no edema and no tenderness.       Cervical back: Normal. He exhibits no tenderness.       Thoracic back: Normal. He exhibits no tenderness.       Lumbar back: He exhibits decreased range of motion. He exhibits no tenderness.  Neurological: He is alert. He has normal strength. No sensory deficit. Cranial nerve deficit:  no gross defecits noted. He exhibits normal muscle tone. He displays no seizure activity. Coordination normal.  Skin: Skin is warm and dry. No rash noted.  Psychiatric: He has a normal mood and affect.    ED Course  Procedures (including critical care time) EKG Normal sinus rhythm rate 77, premature ventricular complex First degree AV block Left axis deviation Normal ST T waves No prior EKG for  comparison  4:34 PM patient now complains of pain in his wrist and his left rib cage. I will add on x-rays of his chest and his left wrist  Labs Reviewed  COMPREHENSIVE METABOLIC PANEL - Abnormal; Notable for the following:    Sodium 134 (*)    BUN 40 (*)    Creatinine, Ser 3.69 (*)    GFR calc non Af Amer 17 (*)    GFR calc Af Amer 20 (*)    All other components within normal limits  CBC WITH DIFFERENTIAL  PROTIME-INR  POCT CBG (FASTING - GLUCOSE)-MANUAL ENTRY   Ct Head Wo Contrast  03/03/2013   *RADIOLOGY REPORT*  Clinical Data: Loss of consciousness with dizziness and blurred vision. History of hypertension.  CT HEAD WITHOUT CONTRAST  Technique:  Contiguous axial images were obtained from the base of the skull through the vertex without contrast.  Comparison: None.  Findings: There is no evidence for acute infarction, intracranial hemorrhage, mass lesion, hydrocephalus, or extra-axial fluid. There is no atrophy or white matter disease.  No large vessel infarct is seen.  The calvarium is intact.  Negative orbits.  No mastoid fluid.  Moderate fluid accumulation in the bilateral maxillary sinuses with air-fluid levels suggesting acuity.  Moderate bilateral ethmoid air cell fluid is noted as well.  IMPRESSION: No acute intracranial findings.  Acute bilateral maxillary sinusitis.  Bilateral ethmoid sinus disease is also present.   Original Report Authenticated By: Davonna Belling, M.D.     MDM  Syncope Patient's symptoms are most likely related to heat exhaustion.   Labs do show  acute renal failure. Patient will be admitted to hospital for IV hydration and monitoring.  If the symptoms are related to dehydration and heat exhaustion patient improved with IV hydration  Injuries Patient has mild chin laceration. No suturing is required. Patient is now complaining of pain in the rib margins left wrist. I will add on x-rays.        Celene Kras, MD 03/03/13 [phone]

## 2013-03-03 NOTE — ED Notes (Signed)
Per Pt:  Pt was working inside a hot building and was drinking plenty of water.  All of a sudden felt dizzy, vision became blurry and then pt lost consciousness and hit mouth and chin area on concrete floor.  Pt did not tell anyone he passed out until pt gained consciousness...told coworker about 5 minutes later.  Laceration to right chin and bottom front tooth bleeding.

## 2013-03-03 NOTE — Progress Notes (Signed)
ANTICOAGULATION CONSULT NOTE - Initial Consult  Pharmacy Consult for:  IV Heparin Indication:  History of DVT, Xarelto on hold due to acute renal insufficiency  Allergies  Allergen Reactions  . Lisinopril Swelling  . Tramadol Nausea And Vomiting    Patient Measurements: Height: 6\' 1"  (185.4 cm) Weight: 207 lb 11.2 oz (94.212 kg) IBW/kg (Calculated) : 79.9  Vital Signs: Temp: 97.7 F (36.5 C) (06/19 1344) Temp src: Oral (06/19 1344) BP: 96/56 mmHg (06/19 1344) Pulse Rate: 81 (06/19 1344)  Labs:  Recent Labs  03/03/13 1426  HGB 14.9  HCT 44.1  PLT 235  LABPROT 21.0*  INR 1.89*  CREATININE 3.69*  CKTOTAL 967*    Estimated Creatinine Clearance: 25 ml/min (by C-G formula based on Cr of 3.69).   Medical History: Past Medical History  Diagnosis Date  . Hypertension   . Back pain   . 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 Knee joint pain   . Pneumonia     Medications:  Scheduled:  . folic acid  1 mg Oral Daily  . multivitamin with minerals  1 tablet Oral Daily  . nicotine  14 mg Transdermal Daily  . sodium chloride  3 mL Intravenous Q12H  . thiamine  100 mg Oral Daily   Or  . thiamine  100 mg Intravenous Daily    Assessment:  Asked to assist with IV Heparin therapy for this 57 year-old male with history of DVT in February 2014.    Mr. Moede is currently taking Xarelto 20 mg daily.  He states that his last dose was taken today, 6/19, at about 5:20 a.m.  According to the Anticoagulation Protocol and package insert, when switching from Xarelto to IV Heparin, the Heparin infusion will be started 24 hours after the last dose of Xarelto.  The reason for admission is syncope and collapse likely due to working in hot environment.    Found to have acute kidney injury with CrCl 25 ml/min.  CT head showed no acute intracranial abnormalities.  Goals of Therapy:   Heparin level 0.3-0.7 units/ml  Monitor platelets by anticoagulation  protocol: Yes   Plan:   Begin IV Heparin infusion at 05:30 03/04/13, to infuse at 1600 units/hr.  Heparin level 6 hours after starting Heparin.  Daily CBC and Heparin level.  Polo Riley R.Ph. 03/03/2013 6:37 PM

## 2013-03-03 NOTE — H&P (Signed)
[facility] History and Physical  Fabrice Dyal ZOX:[id number] DOB: 15-Jul-1956 DOA: 03/03/2013  Referring physician: er PCP: Provider Not In System  Specialists:   Chief Complaint: "passed out"  HPI: Malik Mullins is a 57 y.o. male  Who was working in a hot building with no AC and felt dizzy, his vision became blurry and he lost consciousness (he thinks).  He does not know for how long he was out.  He belives he hit his mouth and chin on the concrete.  He told co-workers what happened and they brought him to the ER.  He had dizziness when he presented to the ER but that has since resolved.  C/o some rib pain.  No HA, no fevers, no CP, no SOB  C/o wrist pain (x ray negative); rib pain- x ray negative  In the ER, his Cr was found to be elevated at 3+ when baseline is 1+.  He was in the hospital in Feb with b/L DVT  Review of Systems: all systems reviewed, negative unless stated above   Past Medical History  Diagnosis Date  . Hypertension   . Back pain   . 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 Knee joint pain   . Pneumonia    Past Surgical History  Procedure Laterality Date  . Mandible surgery     Social History:  reports that he has been smoking Cigarettes.  He has a 12 pack-year smoking history. He has never used smokeless tobacco. He reports that  drinks alcohol. He reports that he does not use illicit drugs.   Allergies  Allergen Reactions  . Lisinopril Swelling  . Tramadol Nausea And Vomiting    Family History  Problem Relation Age of Onset  . Hypertension Mother   . Diabetes Father     Prior to Admission medications   Medication Sig Start Date End Date Taking? Authorizing Provider  albuterol (PROVENTIL HFA;VENTOLIN HFA) 108 (90 BASE) MCG/ACT inhaler Inhale 2 puffs into the lungs every 6 (six) hours as needed for wheezing or shortness of breath.    Yes Historical Provider, MD  lisinopril (PRINIVIL,ZESTRIL) 2.5 MG tablet Take 2.5 mg by  mouth daily.   Yes Historical Provider, MD  Multiple Vitamin (MULTIVITAMIN WITH MINERALS) TABS Take 1 tablet by mouth every morning.    Yes Historical Provider, MD  Rivaroxaban (XARELTO) 20 MG TABS Take 20 mg by mouth daily.   Yes Historical Provider, MD   Physical Exam: Filed Vitals:   03/03/13 1344  BP: 96/56  Pulse: 81  Temp: 97.7 F (36.5 C)  TempSrc: Oral  Resp: 20  SpO2: 98%     General:  A+Ox3, NAD  Eyes: wnl  ENT: wnl  Neck: supple  Cardiovascular: rrr  Respiratory: clear, no wheezing  Abdomen: +BS,s oft  Skin: laceration on chin  Musculoskeletal: moves all 4 ext  Psychiatric: normal mood/affect  Neurologic: SN 2-12 intact  Labs on Admission:  Basic Metabolic Panel:  Recent Labs Lab 03/03/13 1426  NA 134*  K 3.5  CL 98  CO2 19  GLUCOSE 92  BUN 40*  CREATININE 3.69*  CALCIUM 9.9   Liver Function Tests:  Recent Labs Lab 03/03/13 1426  AST 37  ALT 20  ALKPHOS 99  BILITOT 0.6  PROT 7.7  ALBUMIN 3.7   No results found for this basename: LIPASE, AMYLASE,  in the last 168 hours No results found for this basename: AMMONIA,  in the last 168  hours CBC:  Recent Labs Lab 03/03/13 1426  WBC 9.5  NEUTROABS 7.2  HGB 14.9  HCT 44.1  MCV 79.9  PLT 235   Cardiac Enzymes: No results found for this basename: CKTOTAL, CKMB, CKMBINDEX, TROPONINI,  in the last 168 hours  BNP (last 3 results) No results found for this basename: PROBNP,  in the last 8760 hours CBG: No results found for this basename: GLUCAP,  in the last 168 hours  Radiological Exams on Admission: Ct Head Wo Contrast  03/03/2013   *RADIOLOGY REPORT*  Clinical Data: Loss of consciousness with dizziness and blurred vision. History of hypertension.  CT HEAD WITHOUT CONTRAST  Technique:  Contiguous axial images were obtained from the base of the skull through the vertex without contrast.  Comparison: None.  Findings: There is no evidence for acute infarction, intracranial  hemorrhage, mass lesion, hydrocephalus, or extra-axial fluid. There is no atrophy or white matter disease.  No large vessel infarct is seen.  The calvarium is intact.  Negative orbits.  No mastoid fluid.  Moderate fluid accumulation in the bilateral maxillary sinuses with air-fluid levels suggesting acuity.  Moderate bilateral ethmoid air cell fluid is noted as well.  IMPRESSION: No acute intracranial findings.  Acute bilateral maxillary sinusitis.  Bilateral ethmoid sinus disease is also present.   Original Report Authenticated By: Davonna Belling, M.D.    EKG: Independently reviewed. NSR  Assessment/Plan Active Problems:   Hypertension   DVT of lower extremity, bilateral   AKI (acute kidney injury)   Syncope and collapse   1. syncope- most like due to dehydration/heat exhaustion: IVF and monitor BMP, watch on tele 2. AKI on CKD- baseline about 1.4- IVF 3. DVT- in Feb 2014- check duplex and place on heparin for now while kidney function recovers 4. HTn- now hypotensive- hold medications 5. Tobacco abuse- patch 6. Alcohol use- CIWA   Code Status: full Family Communication: patient at bedside Disposition Plan: home 1-2 days  Time spent: 70 min  Conlin Brahm [facility] Pager [phone]  If 7PM-7AM, please contact night-coverage [email or web address] Password [facility] 03/03/2013, 4:49 PM

## 2013-03-03 NOTE — ED Notes (Signed)
Patient transported to X-ray 

## 2013-03-04 ENCOUNTER — Other Ambulatory Visit: Payer: Self-pay

## 2013-03-04 DIAGNOSIS — R55 Syncope and collapse: Secondary | ICD-10-CM

## 2013-03-04 DIAGNOSIS — I471 Supraventricular tachycardia: Secondary | ICD-10-CM

## 2013-03-04 DIAGNOSIS — Z86718 Personal history of other venous thrombosis and embolism: Secondary | ICD-10-CM

## 2013-03-04 DIAGNOSIS — I442 Atrioventricular block, complete: Secondary | ICD-10-CM

## 2013-03-04 DIAGNOSIS — I82409 Acute embolism and thrombosis of unspecified deep veins of unspecified lower extremity: Secondary | ICD-10-CM

## 2013-03-04 LAB — BASIC METABOLIC PANEL
BUN: 24 mg/dL — ABNORMAL HIGH (ref 6–23)
CO2: 24 mEq/L (ref 19–32)
Calcium: 8.6 mg/dL (ref 8.4–10.5)
Chloride: 102 mEq/L (ref 96–112)
Creatinine, Ser: 1.8 mg/dL — ABNORMAL HIGH (ref 0.50–1.35)
Glucose, Bld: 109 mg/dL — ABNORMAL HIGH (ref 70–99)

## 2013-03-04 LAB — CBC
HCT: 40.5 % (ref 39.0–52.0)
Hemoglobin: 13.4 g/dL (ref 13.0–17.0)
MCV: 80.7 fL (ref 78.0–100.0)
RBC: 5.02 MIL/uL (ref 4.22–5.81)
RDW: 13.6 % (ref 11.5–15.5)
WBC: 6.5 10*3/uL (ref 4.0–10.5)

## 2013-03-04 LAB — MRSA PCR SCREENING: MRSA by PCR: NEGATIVE

## 2013-03-04 LAB — T4, FREE: Free T4: 1.14 ng/dL (ref 0.80–1.80)

## 2013-03-04 MED ORDER — HEPARIN SODIUM (PORCINE) 5000 UNIT/ML IJ SOLN
5000.0000 [IU] | Freq: Three times a day (TID) | INTRAMUSCULAR | Status: DC
  Administered 2013-03-04 – 2013-03-05 (×3): 5000 [IU] via SUBCUTANEOUS
  Filled 2013-03-04 (×6): qty 1

## 2013-03-04 MED ORDER — ATROPINE SULFATE 0.1 MG/ML IJ SOLN
INTRAMUSCULAR | Status: AC
  Filled 2013-03-04: qty 10

## 2013-03-04 NOTE — Progress Notes (Signed)
*  PRELIMINARY RESULTS* Echocardiogram 2D Echocardiogram has been performed.  Jeryl Columbia 03/04/2013, 1:50 PM

## 2013-03-04 NOTE — Progress Notes (Signed)
VASCULAR LAB PRELIMINARY  PRELIMINARY  PRELIMINARY  PRELIMINARY  Bilateral lower extremity venous duplex  completed.    Preliminary report:  Bilateral:  No evidence of DVT, superficial thrombosis, or Baker's Cyst.    Romona Murdy, RVT 03/04/2013, 3:53 PM

## 2013-03-04 NOTE — Care Management Note (Signed)
    Page 1 of 1   03/04/2013     2:04:31 PM   CARE MANAGEMENT NOTE 03/04/2013  Patient:  Malik Mullins, Malik Mullins   Account Number:  [id number]  Date Initiated:  03/04/2013  Documentation initiated by:  Lanier Clam  Subjective/Objective Assessment:   ADMITTEDW/HTN,SYNCOPE.WU:[id number].     Action/Plan:   FROM HOME ALONE.HAS PCP,PHARMACY.   Anticipated DC Date:  03/07/2013   Anticipated DC Plan:  HOME/SELF CARE      DC Planning Services  CM consult      Choice offered to / List presented to:             Status of service:  In process, will continue to follow Medicare Important Message given?   (If response is "NO", the following Medicare IM given date fields will be blank) Date Medicare IM given:   Date Additional Medicare IM given:    Discharge Disposition:  ACUTE TO ACUTE TRANS  Per UR Regulation:  Reviewed for med. necessity/level of care/duration of stay  If discussed at Long Length of Stay Meetings, dates discussed:    Comments:  03/04/13 Zarion Oliff RN,BSN NCM [phone] SINUSPAUSES,?PACE MA[employer]ER.TRANSFER TO [facility].

## 2013-03-04 NOTE — Consult Note (Signed)
Admit date: 03/03/2013 Referring Physician  Dr. Jomarie Longs Primary Physician  ?? Primary Cardiologist  HWBSmith, III, MD Reason for Consultation  Syncope  ASSESSMENT: 1. Syncope, uncertain cause but could be related to AV block versus dehydration  2. 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 The most severe high-grade abnormalities were noted during sleep  3. History of snoring  4. Hypertension  5. History of DVT  6. Acute kidney injury  PLAN:  1. We will get an electrophysiology consultation  2. Will likely need a sleep study  3. Hydration  4. 2-D Doppler echocardiogram   HPI: the patient is 57 years old and fainted yesterday while working in a hot environment. He did well for the first 2 hours at work but then became dizzy. The episodes are associated with chest pain or other complaints than dizziness. He has had intermittent dizziness over the years. He is also now one prior to fainting episode greater than 10 years ago.  He is not married. States his girlfriend mentions that he snores and that sometimes she wonders if he is breathing.   PMH:   Past Medical History  Diagnosis Date  . Hypertension   . Back pain   . 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 Knee joint pain   . Pneumonia      PSH:   Past Surgical History  Procedure Laterality Date  . Mandible surgery      Allergies:  Lisinopril and Tramadol Prior to Admit Meds:   Prescriptions prior to admission  Medication Sig Dispense Refill  . albuterol (PROVENTIL HFA;VENTOLIN HFA) 108 (90 BASE) MCG/ACT inhaler Inhale 2 puffs into the lungs every 6 (six) hours as needed for wheezing or shortness of breath.       . lisinopril (PRINIVIL,ZESTRIL) 2.5 MG tablet Take 2.5 mg by mouth daily.      . Multiple Vitamin (MULTIVITAMIN WITH MINERALS) TABS Take 1 tablet by mouth every morning.       . Rivaroxaban (XARELTO) 20 MG TABS Take 20 mg by mouth daily.       Fam HX:      Family History  Problem Relation Age of Onset  . Hypertension Mother   . Diabetes Father    Social HX:    History   Social History  . Marital Status: Married    Spouse Name: N/A    Number of Children: N/A  . Years of Education: N/A   Occupational History  . Not on file.   Social History Main Topics  . Smoking status: Current Some Day Smoker -- 0.30 packs/day for 40 years    Types: Cigarettes  . Smokeless tobacco: Never Used  . Alcohol Use: Yes     Comment: "from time to time"  . Drug Use: No  . Sexually Active: No   Other Topics Concern  . Not on file   Social History Narrative  . No narrative on file     Review of Systems: He denies palpitations, orthopnea, PND, chest pain, history of heart disease, and stroke.  Physical Exam: Blood pressure 109/67, pulse 66, temperature 98 F (36.7 C), temperature source Oral, resp. rate 20, height 6\' 1"  (1.854 m), weight 94.212 kg (207 lb 11.2 oz), SpO2 100.00%. Weight change:    The patient is lying comfortably in bed. No acute distress.  The left corner of his upper lip is swollen. He states that his upper  tooth is loose.  No JVD or carotid bruits.  The chest is clear to auscultation and percussion.  The cardiac exam reveals an S4 gallop have a slightly irregular rhythm. No murmur.  Abdomen somewhat distended but nontender. Bowel sounds are normal.  The extremities reveal no edema. Radial pulses and posterior tibial pulses are 2+.  Neurologically the patient is alert and oriented x3. He is able to engage in conversation normally. No obvious focal motor or cranial nerve deficit is identified. Labs:   Lab Results  Component Value Date   WBC 6.5 03/04/2013   HGB 13.4 03/04/2013   HCT 40.5 03/04/2013   MCV 80.7 03/04/2013   PLT 211 03/04/2013    Recent Labs Lab 03/03/13 1426 03/04/13 0651  NA 134* 134*  K 3.5 3.4*  CL 98 102  CO2 19 24  BUN 40* 24*  CREATININE 3.69* 1.80*  CALCIUM 9.9 8.6  PROT 7.7  --    BILITOT 0.6  --   ALKPHOS 99  --   ALT 20  --   AST 37  --   GLUCOSE 92 109*   No results found for this basename: PTT   Lab Results  Component Value Date   INR 1.89* 03/03/2013   INR 2.00* 11/01/2012   INR 1.48 10/31/2012   Lab Results  Component Value Date   CKTOTAL 967* 03/03/2013   TROPONINI <0.30 03/04/2013     Lab Results  Component Value Date   CHOL 188 10/12/2008   CHOL 183 09/07/2007   CHOL 180 05/14/2007   Lab Results  Component Value Date   HDL 50 10/12/2008   HDL 56 08/65/7846   HDL 51 05/14/2007   Lab Results  Component Value Date   LDLCALC 82 10/12/2008   LDLCALC 86 09/07/2007   LDLCALC 79 05/14/2007   Lab Results  Component Value Date   TRIG 282* 10/12/2008   TRIG 203* 09/07/2007   TRIG 250* 05/14/2007   Lab Results  Component Value Date   CHOLHDL 3.8 Ratio 10/12/2008   CHOLHDL 3.3 Ratio 09/07/2007   CHOLHDL 3.5 Ratio 05/14/2007   No results found for this basename: LDLDIRECT      Radiology:  Dg Chest 2 View  03/03/2013   *RADIOLOGY REPORT*  Clinical Data: Pain, syncope  CHEST - 2 VIEW  Comparison: Prior chest x-ray 10/27/2012  Findings: Similar appearance of the chest with pulmonary hyperexpansion, prominence of the lower lobe interstitial markings and central bronchitic changes.  Cardiac and mediastinal contours remain unchanged within normal limits.  Degenerative changes noted at both glenohumeral joints.  The humeral heads are high-riding suggesting chronic rotator cuff injuries.  No acute osseous abnormality.  Multilevel degenerative changes throughout the visualized spine.  IMPRESSION:  1.  No acute cardiopulmonary process. 2.  COPD.   Original Report Authenticated By: Malachy Moan, M.D.   Dg Wrist Complete Left  03/03/2013   *RADIOLOGY REPORT*  Clinical Data: Pain post fall.  LEFT WRIST - COMPLETE 3+ VIEW  Comparison: None.  Findings: Degenerative changes of distal radioulnar joint with a ring of osteophytes around the distal ulna.  Carpal rows  intact. Negative for fracture, dislocation, or other acute abnormality. Normal alignment and mineralization. No other significant degenerative change.  Regional soft tissues unremarkable.  IMPRESSION:  1.  Negative for fracture or other acute bony abnormality. 2. DRUJ osteoarthritis.   Original Report Authenticated By: D. Andria Rhein, MD   Ct Head Wo Contrast  03/03/2013   *RADIOLOGY REPORT*  Clinical Data:  Loss of consciousness with dizziness and blurred vision. History of hypertension.  CT HEAD WITHOUT CONTRAST  Technique:  Contiguous axial images were obtained from the base of the skull through the vertex without contrast.  Comparison: None.  Findings: There is no evidence for acute infarction, intracranial hemorrhage, mass lesion, hydrocephalus, or extra-axial fluid. There is no atrophy or white matter disease.  No large vessel infarct is seen.  The calvarium is intact.  Negative orbits.  No mastoid fluid.  Moderate fluid accumulation in the bilateral maxillary sinuses with air-fluid levels suggesting acuity.  Moderate bilateral ethmoid air cell fluid is noted as well.  IMPRESSION: No acute intracranial findings.  Acute bilateral maxillary sinusitis.  Bilateral ethmoid sinus disease is also present.   Original Report Authenticated By: Davonna Belling, M.D.   EKG:  Normal sinus rhythm with first-degree AV block.  CARDIAC MONITOR: during the night frequent we did not second-degree AV block was noted. At around 2:03 AM the patient had a prolonged period of high grade AV block lasting greater than 5 seconds. He states that people came into his room and awakened him. He was asymptomatic at the time.    Lesleigh Noe 03/04/2013 9:53 AM

## 2013-03-04 NOTE — Consult Note (Signed)
ELECTROPHYSIOLOGY CONSULT NOTE    Patient ID: Malik Mullins MRN: [id number], DOB/AGE: 58-30-1957 58 y.o.  Admit date: 03/03/2013 Date of Consult: 03-04-2013  Primary Physician: [facility] Primary Cardiologist: Garnette Scheuermann, MD  Reason for Consultation: syncope and heart block  HPI:  Malik Mullins is a 57 year old male with a history of hypertension, tobacco use and DVT.  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  He then passed out.  When he awoke, he sat on the landing for a little while and then presented to the ER for further evaluation.  On arrival to the ER, he was found to be in ARF (creat 3.69 BUN 40).  He was hydrated with IVF and his creat improved to 1.80 (baseline from April of this year 1.4).    The patient was dizzy for 10-15 minutes prior to loss of consciousness. He had persistent orthostatic intolerance causing him to fall once he started and it had persistent lightheadedness for minutes afterwards.  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  It was a similar situation where he was working outside, got hot, and then passed out.  Other lab work was unremarkable.  On telemetry overnight last night, he had an 8 second period of high grade heart block while he was sleeping.  His girlfriend says that he snores, but he has never been evaluated for sleep apnea.    He denies symptoms of palpitations or chest pain.  He does report shortness of breath on exertion.  He had lower extremity edema associated with his prior DVT. He in fact has a history of bilateral DVT presenting at a different times.  There is no family history of DVT. There is a family history of sudden infant death syndrome without prior history of syncope. I should note that the sons mothers family history is no longer available  Of note, his Xarelto was discontinued in the  setting of his ARF on presentation, but kidney function is now not prohibitive.  EP has been asked to evaluate for treatment options.  ROS is negative except as outlined above.    Past Medical History  Diagnosis Date  . Hypertension   . Back pain   . 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 Knee joint pain   . Pneumonia      Surgical History:  Past Surgical History  Procedure Laterality Date  . Mandible surgery       Prescriptions prior to admission  Medication Sig Dispense Refill  . albuterol (PROVENTIL HFA;VENTOLIN HFA) 108 (90 BASE) MCG/ACT inhaler Inhale 2 puffs into the lungs every 6 (six) hours as needed for wheezing or shortness of breath.       . lisinopril (PRINIVIL,ZESTRIL) 2.5 MG tablet Take 2.5 mg by mouth daily.      . Multiple Vitamin (MULTIVITAMIN WITH MINERALS) TABS Take 1 tablet by mouth every morning.       . Rivaroxaban (XARELTO) 20 MG TABS Take 20 mg by mouth daily.        Inpatient Medications:  . folic acid  1 mg Oral Daily  . heparin subcutaneous  5,000 Units Subcutaneous Q8H  . multivitamin with minerals  1 tablet Oral Daily  . nicotine  14 mg Transdermal Daily  . sodium chloride  3  mL Intravenous Q12H  . thiamine  100 mg Oral Daily    Allergies:  Allergies  Allergen Reactions  . Lisinopril Swelling  . Tramadol Nausea And Vomiting    History   Social History  . Marital Status: Married    Spouse Name: N/A    Number of Children: N/A  . Years of Education: N/A   Occupational History  . Not on file.   Social History Main Topics  . Smoking status: Current Some Day Smoker -- 0.30 packs/day for 40 years    Types: Cigarettes  . Smokeless tobacco: Never Used  . Alcohol Use: Yes     Comment: "from time to time"  . Drug Use: No  . Sexually Active: No   Other Topics Concern  . Not on file   Social History Narrative  . No narrative on file     Family History  Problem Relation Age of Onset  . Hypertension Mother     . Diabetes Father       BP 128/60  Pulse 70  Temp(Src) 98 F (36.7 C) (Oral)  Resp 15  Ht 6' 1.5" (1.867 m)  Wt 219 lb 2.2 oz (99.4 kg)  BMI 28.52 kg/m2  SpO2 99% Alert and oriented in no acute distress HENT- normal Eyes- EOMI, without scleral icterus Skin- warm and dry; without rashes LN-neg Neck- supple without thyromegaly, JVP-8-9 , carotids brisk and full without bruits Back-without CVAT or kyphosis Lungs-clear to auscultation CV-Regular rate and rhythm, nl S1 and S2, no murmurs gallops or rubs, S4-absent Abd-soft with active bowel sounds; no midline pulsation or hepatomegaly Pulses-intact femoral and distal MKS-without gross deformity Neuro- Ax O, CN3-12 intact, grossly normal motor and sensory function Affect engaging    Labs:   Lab Results  Component Value Date   WBC 6.5 03/04/2013   HGB 13.4 03/04/2013   HCT 40.5 03/04/2013   MCV 80.7 03/04/2013   PLT 211 03/04/2013    Recent Labs Lab 03/03/13 1426 03/04/13 0651  NA 134* 134*  K 3.5 3.4*  CL 98 102  CO2 19 24  BUN 40* 24*  CREATININE 3.69* 1.80*  CALCIUM 9.9 8.6  PROT 7.7  --   BILITOT 0.6  --   ALKPHOS 99  --   ALT 20  --   AST 37  --   GLUCOSE 92 109*   Lab Results  Component Value Date   CKTOTAL 967* 03/03/2013   TROPONINI <0.30 03/04/2013    Radiology/Studies: Dg Chest 2 View 03/03/2013   *RADIOLOGY REPORT*  Clinical Data: Pain, syncope  CHEST - 2 VIEW  Comparison: Prior chest x-ray 10/27/2012  Findings: Similar appearance of the chest with pulmonary hyperexpansion, prominence of the lower lobe interstitial markings and central bronchitic changes.  Cardiac and mediastinal contours remain unchanged within normal limits.  Degenerative changes noted at both glenohumeral joints.  The humeral heads are high-riding suggesting chronic rotator cuff injuries.  No acute osseous abnormality.  Multilevel degenerative changes throughout the visualized spine.  IMPRESSION:  1.  No acute cardiopulmonary process.  2.  COPD.   Original Report Authenticated By: Malachy Moan, M.D.   ZOX:WRUEA rhythm with 1st degree AV block  TELEMETRY: sinus rhythm, nocturnal asymptomatic high grade heart block  ECHO: done, but not yet read  Active Problems:   Hypertension   DVT of lower extremity, bilateral   AKI (acute kidney injury)   Syncope and collapse   Transient complete heart block  The patient has nocturnal complete heart  block. This likely relates to sleep apnea. He has had Wenckebach block today without symptoms. His syncope was associated with lightheadedness that lasted minutes both pre-and post normal certainly was related to low blood pressure and a story of ambient hyperthermia is certainly compelling especially with his creatinine abnormality.  At this point I do not think pacing is indicated. Ongoing monitoring is certainly reasonable.  They are 2 other issues however that prompt concern. The first is understanding the cause of his bilateral DVT that presented over a year. He also has elevated neck veins raising the possibility of some degree of pulmonary hypertension and raises the possibility of pulmonary embolism. Lifelong anticoagulation would seem to be in order from my position vigorous given the recurring DVT. However, I think there may also be value and try to understand whether there is a predisposition to this hypercoagulable problem that might be genetic and leave his daughter at risk. The second issue relates to the above meds he evidence of elevated right-sided pressures. His echocardiogram should help clarify some of this  We will follow from  ar far please let us know if we can help

## 2013-03-04 NOTE — H&P (Addendum)
[facility] PROGRESS NOTE  Malik Mullins UJW:[id number] DOB: 1955/10/28 DOA: 03/03/2013 PCP: Provider Not In System  Assessment/Plan: 1. Heart block/ Multiple sinus pauses 2, 6 and 8 sec long overnight, asymptomatic overnight - but syncopal event yesterday, which was originally attributed to heat exhaustion/dehydration. -Put pacer pads on pt -Cards Dr.Smith consult appreciated, will need EP eval for pacemaker -Transfer to SDU at [facility] -check TSH, T4, ECHO  2. ARF/dehydration -worsened by ACE -continue IVF today  3. H/o DVT in 2/14, was on xarelto prior to admission -this was changed to heparin yesterday in setting of ARF -will stop Heparin, due to possible need for pacer -should be able to resume xarelto after intervention since GFR improving  4. HTN: -ACE on hold due to 1  DVT proph: SCDs for now   Code Status:FULL Family Communication: none at bedside Disposition Plan: Transfer to CCU at [facility], D/w Dr.Rizwan and Dr.Smith and Company secretary at [employer]:  Dr.Smith, Cards  HPI/Subjective: Multiple long sinus pauses yetserday  Objective: Filed Vitals:   03/03/13 1810 03/03/13 1822 03/03/13 2203 03/04/13 0621  BP:  118/69 106/53 109/67  Pulse:  76 71 66  Temp:  97.8 F (36.6 C) 98.1 F (36.7 C) 98 F (36.7 C)  TempSrc:  Oral Oral Oral  Resp:  18 16 20   Height: 6\' 1"  (1.854 m)     Weight: 94.212 kg (207 lb 11.2 oz)     SpO2:  100% 96% 100%    Intake/Output Summary (Last 24 hours) at 03/04/13 0830 Last data filed at 03/04/13 5621  Gross per 24 hour  Intake 2501.73 ml  Output   1450 ml  Net 1051.73 ml   Filed Weights   03/03/13 1810  Weight: 94.212 kg (207 lb 11.2 oz)    Exam:   General: AAOx3, no distress  Cardiovascular:S1S2/RRR  Respiratory: CTAB  Abdomen: soft, Nt, BS present  Musculoskeletal: no edema c/c   Data Reviewed: Basic Metabolic Panel:  Recent Labs Lab 03/03/13 1426 03/04/13 0651  NA 134* 134*  K 3.5 3.4*   CL 98 102  CO2 19 24  GLUCOSE 92 109*  BUN 40* 24*  CREATININE 3.69* 1.80*  CALCIUM 9.9 8.6  MG  --  2.1   Liver Function Tests:  Recent Labs Lab 03/03/13 1426  AST 37  ALT 20  ALKPHOS 99  BILITOT 0.6  PROT 7.7  ALBUMIN 3.7   No results found for this basename: LIPASE, AMYLASE,  in the last 168 hours No results found for this basename: AMMONIA,  in the last 168 hours CBC:  Recent Labs Lab 03/03/13 1426 03/04/13 0651  WBC 9.5 6.5  NEUTROABS 7.2  --   HGB 14.9 13.4  HCT 44.1 40.5  MCV 79.9 80.7  PLT 235 211   Cardiac Enzymes:  Recent Labs Lab 03/03/13 1426 03/03/13 1840 03/04/13 0013 03/04/13 0651  CKTOTAL 967*  --   --   --   TROPONINI  --  <0.30 <0.30 <0.30   BNP (last 3 results) No results found for this basename: PROBNP,  in the last 8760 hours CBG: No results found for this basename: GLUCAP,  in the last 168 hours  No results found for this or any previous visit (from the past 240 hour(s)).   Studies: Dg Chest 2 View  03/03/2013   *RADIOLOGY REPORT*  Clinical Data: Pain, syncope  CHEST - 2 VIEW  Comparison: Prior chest x-ray 10/27/2012  Findings: Similar appearance of the chest with pulmonary  hyperexpansion, prominence of the lower lobe interstitial markings and central bronchitic changes.  Cardiac and mediastinal contours remain unchanged within normal limits.  Degenerative changes noted at both glenohumeral joints.  The humeral heads are high-riding suggesting chronic rotator cuff injuries.  No acute osseous abnormality.  Multilevel degenerative changes throughout the visualized spine.  IMPRESSION:  1.  No acute cardiopulmonary process. 2.  COPD.   Original Report Authenticated By: Malachy Moan, M.D.   Dg Wrist Complete Left  03/03/2013   *RADIOLOGY REPORT*  Clinical Data: Pain post fall.  LEFT WRIST - COMPLETE 3+ VIEW  Comparison: None.  Findings: Degenerative changes of distal radioulnar joint with a ring of osteophytes around the distal ulna.   Carpal rows intact. Negative for fracture, dislocation, or other acute abnormality. Normal alignment and mineralization. No other significant degenerative change.  Regional soft tissues unremarkable.  IMPRESSION:  1.  Negative for fracture or other acute bony abnormality. 2. DRUJ osteoarthritis.   Original Report Authenticated By: D. Andria Rhein, MD   Ct Head Wo Contrast  03/03/2013   *RADIOLOGY REPORT*  Clinical Data: Loss of consciousness with dizziness and blurred vision. History of hypertension.  CT HEAD WITHOUT CONTRAST  Technique:  Contiguous axial images were obtained from the base of the skull through the vertex without contrast.  Comparison: None.  Findings: There is no evidence for acute infarction, intracranial hemorrhage, mass lesion, hydrocephalus, or extra-axial fluid. There is no atrophy or white matter disease.  No large vessel infarct is seen.  The calvarium is intact.  Negative orbits.  No mastoid fluid.  Moderate fluid accumulation in the bilateral maxillary sinuses with air-fluid levels suggesting acuity.  Moderate bilateral ethmoid air cell fluid is noted as well.  IMPRESSION: No acute intracranial findings.  Acute bilateral maxillary sinusitis.  Bilateral ethmoid sinus disease is also present.   Original Report Authenticated By: Davonna Belling, M.D.    Scheduled Meds: . atropine      . folic acid  1 mg Oral Daily  . multivitamin with minerals  1 tablet Oral Daily  . nicotine  14 mg Transdermal Daily  . sodium chloride  3 mL Intravenous Q12H  . thiamine  100 mg Oral Daily   Or  . thiamine  100 mg Intravenous Daily   Continuous Infusions: . sodium chloride 1,000 mL (03/04/13 0051)  . sodium chloride 1,000 mL (03/03/13 1823)  . heparin 1,600 Units/hr (03/04/13 1610)    Active Problems:   Hypertension   DVT of lower extremity, bilateral   AKI (acute kidney injury)   Syncope and collapse    Time spent:5min    Malik Mullins  [facility] Pager [phone] If  7PM-7AM, please contact night-coverage at [email or web address], password [facility] 03/04/2013, 8:30 AM  LOS: 1 day

## 2013-03-04 NOTE — Progress Notes (Signed)
Received called from CMT that patient had 8.06 sec pause. Patient was sleeping at the time. Patient was awaken and aroused easily. Later again approx 4am to 4:15 am patient had a 6 sec pause. Patient sleeping. Patient underlining rhythm during the night SB in a 2nd degree HB. Nurse Extender oncall notified and 12 lead EKG obtained.  S.R. Haynes Dage, RN,BSN

## 2013-03-05 LAB — BASIC METABOLIC PANEL
Chloride: 104 mEq/L (ref 96–112)
GFR calc Af Amer: 62 mL/min — ABNORMAL LOW (ref >90)
GFR calc non Af Amer: 53 mL/min — ABNORMAL LOW (ref >90)
Glucose, Bld: 105 mg/dL — ABNORMAL HIGH (ref 70–99)
Potassium: 3.8 mEq/L (ref 3.5–5.1)
Sodium: 138 mEq/L (ref 135–145)

## 2013-03-05 LAB — CBC
HCT: 41.8 % (ref 39.0–52.0)
Hemoglobin: 13.7 g/dL (ref 13.0–17.0)
MCHC: 32.8 g/dL (ref 30.0–36.0)
WBC: 6.1 10*3/uL (ref 4.0–10.5)

## 2013-03-05 MED ORDER — LISINOPRIL 2.5 MG PO TABS: 2.5000 mg | ORAL_TABLET | Freq: Every day | ORAL | Status: DC

## 2013-03-05 NOTE — Progress Notes (Signed)
Discharge instructions given to pt and family member to include meds, fallow up appointment, diet and activity restrictions, pt and family verbalized understanding and agreement with d/c instructions. Telemetry and Iv d/c site wnl, pt released to care of family member in no apparent distress.

## 2013-03-05 NOTE — Progress Notes (Signed)
SUBJECTIVE:  No complaints  OBJECTIVE:   Vitals:   Filed Vitals:   03/05/13 0300 03/05/13 0400 03/05/13 0824 03/05/13 0825  BP:  87/55 105/58 105/58  Pulse:  77 74 67  Temp: 98.4 F (36.9 C)  98.5 F (36.9 C)   TempSrc: Oral  Oral   Resp:  14 24 14   Height:      Weight:      SpO2:  98% 99% 96%   I&O's:   Intake/Output Summary (Last 24 hours) at 03/05/13 1610 Last data filed at 03/05/13 9604  Gross per 24 hour  Intake   1080 ml  Output   2100 ml  Net  -1020 ml   TELEMETRY: Reviewed telemetry pt in NSR with type I second degree AV block     PHYSICAL EXAM General: Well developed, well nourished, in no acute distress Head: Eyes PERRLA, No xanthomas.   Normal cephalic and atramatic  Lungs:   Clear bilaterally to auscultation and percussion. Heart:   HRRR S1 S2 Pulses are 2+ & equal. Abdomen: Bowel sounds are positive, abdomen soft and non-tender without masses Extremities:   No clubbing, cyanosis or edema.  DP +1 Neuro: Alert and oriented X 3. Psych:  Good affect, responds appropriately   LABS: Basic Metabolic Panel:  Recent Labs  54/09/81 1426 03/04/13 0651 03/05/13 0720  NA 134* 134* 138  K 3.5 3.4* 3.8  CL 98 102 104  CO2 19 24 25   GLUCOSE 92 109* 105*  BUN 40* 24* 15  CREATININE 3.69* 1.80* 1.42*  CALCIUM 9.9 8.6 8.7  MG  --  2.1  --    Liver Function Tests:  Recent Labs  03/03/13 1426  AST 37  ALT 20  ALKPHOS 99  BILITOT 0.6  PROT 7.7  ALBUMIN 3.7   No results found for this basename: LIPASE, AMYLASE,  in the last 72 hours CBC:  Recent Labs  03/03/13 1426 03/04/13 0651 03/05/13 0720  WBC 9.5 6.5 6.1  NEUTROABS 7.2  --   --   HGB 14.9 13.4 13.7  HCT 44.1 40.5 41.8  MCV 79.9 80.7 82.1  PLT 235 211 188   Cardiac Enzymes:  Recent Labs  03/03/13 1426 03/03/13 1840 03/04/13 0013 03/04/13 0651  CKTOTAL 967*  --   --   --   TROPONINI  --  <0.30 <0.30 <0.30   Thyroid Function Tests:  Recent Labs  03/03/13 1840  TSH 1.121    Anemia Panel: No results found for this basename: VITAMINB12, FOLATE, FERRITIN, TIBC, IRON, RETICCTPCT,  in the last 72 hours Coag Panel:   Lab Results  Component Value Date   INR 1.89* 03/03/2013   INR 2.00* 11/01/2012   INR 1.48 10/31/2012    RADIOLOGY: Dg Chest 2 View  03/03/2013   *RADIOLOGY REPORT*  Clinical Data: Pain, syncope  CHEST - 2 VIEW  Comparison: Prior chest x-ray 10/27/2012  Findings: Similar appearance of the chest with pulmonary hyperexpansion, prominence of the lower lobe interstitial markings and central bronchitic changes.  Cardiac and mediastinal contours remain unchanged within normal limits.  Degenerative changes noted at both glenohumeral joints.  The humeral heads are high-riding suggesting chronic rotator cuff injuries.  No acute osseous abnormality.  Multilevel degenerative changes throughout the visualized spine.  IMPRESSION:  1.  No acute cardiopulmonary process. 2.  COPD.   Original Report Authenticated By: Malachy Moan, M.D.   Dg Wrist Complete Left  03/03/2013   *RADIOLOGY REPORT*  Clinical Data: Pain post fall.  LEFT WRIST - COMPLETE 3+ VIEW  Comparison: None.  Findings: Degenerative changes of distal radioulnar joint with a ring of osteophytes around the distal ulna.  Carpal rows intact. Negative for fracture, dislocation, or other acute abnormality. Normal alignment and mineralization. No other significant degenerative change.  Regional soft tissues unremarkable.  IMPRESSION:  1.  Negative for fracture or other acute bony abnormality. 2. DRUJ osteoarthritis.   Original Report Authenticated By: D. Andria Rhein, MD   Ct Head Wo Contrast  03/03/2013   *RADIOLOGY REPORT*  Clinical Data: Loss of consciousness with dizziness and blurred vision. History of hypertension.  CT HEAD WITHOUT CONTRAST  Technique:  Contiguous axial images were obtained from the base of the skull through the vertex without contrast.  Comparison: None.  Findings: There is no evidence for  acute infarction, intracranial hemorrhage, mass lesion, hydrocephalus, or extra-axial fluid. There is no atrophy or white matter disease.  No large vessel infarct is seen.  The calvarium is intact.  Negative orbits.  No mastoid fluid.  Moderate fluid accumulation in the bilateral maxillary sinuses with air-fluid levels suggesting acuity.  Moderate bilateral ethmoid air cell fluid is noted as well.  IMPRESSION: No acute intracranial findings.  Acute bilateral maxillary sinusitis.  Bilateral ethmoid sinus disease is also present.   Original Report Authenticated By: Davonna Belling, M.D.    ASSESSMENT:  1. Syncope, uncertain cause most likely secondary to dehydration  2. Transient high-grade AV block in the setting of baseline first degree AV block. The most severe high-grade abnormalities were noted during sleep - no pacer indicated at this time per EP 3. History of snoring  4. Hypertension  5. History of DVT  6. Acute kidney injury 7. Normal LVF by echo - no pulmonary HTN noted - normal RV and RA PLAN:   1. Outpatient sleep study  2. Hydration  3. Would consider outpatient [employer] monitor to look for higher grade AV block    Quintella Reichert, MD  03/05/2013  9:29 AM

## 2013-03-05 NOTE — Discharge Summary (Signed)
Physician Discharge Summary  Malik Mullins YNW:[id number] DOB: 05-01-1956 DOA: 03/03/2013  PCP: Provider Not In System  Admit date: 03/03/2013 Discharge date: 03/05/2013  Time spent: >30 minutes  Recommendations for Outpatient Follow-up:  -BMET to follow electrolytes and renal function -Reassess BP and start antihypertensive agents if needed -Patient will need to be set up for event monitoring and sleep study.  Discharge Diagnoses:  Active Problems:   Hypertension   DVT of lower extremity, bilateral   AKI (acute kidney injury)   Syncope and collapse   Transient complete heart block   Discharge Condition: stable and improved. Will follow with Dr. Katrinka Blazing on 03/07/13 for further evaluation and treatment.   Diet recommendation: heart healthy diet  Filed Weights   03/03/13 1810 03/04/13 1250  Weight: 94.212 kg (207 lb 11.2 oz) 99.4 kg (219 lb 2.2 oz)    History of present illness:  57 y.o. male Who was working in a hot building with no AC and felt dizzy, his vision became blurry and he lost consciousness (he thinks). He does not know for how long he was out. He belives he hit his mouth and chin on the concrete. He told co-workers what happened and they brought him to the ER. He had dizziness when he presented to the ER but that has since resolved. C/o some rib pain. No HA, no fevers, no CP, no SOB  C/o wrist pain (x ray negative); rib pain- x ray negative  In the ER, his Cr was found to be elevated at 3+ when baseline is 1+. He was in the hospital in Feb with b/L DVT   Hospital Course:  1-Syncope: due to dehydration and heat exhaustion. -denies any feeling of lightheadedness, dizziness or pre-syncope after IVF resuscitation -will d/c home with instructions to hold antihypertensive agents and to keep himself well hydrated  2-HTN: BP has remain soft and controlled w/o meds. Given hypotension on admission and ARF; will hold lisinopril until follow up with his cardiologist/PCP. Patient  advised to follow heart healthy diet  3-History of DVT: will resume xarelto  4-Acute on chronic renal failure: pre-renal in nature due to dehydration, hypotensions and continue use of lisinopril. -back to baseline after holding nephrotoxic agents and IVF's given.  5-alcohol abuse: cessation counseling provided. Will continue MV daily to provide supplementation of folic acid and B1  6-Tobacco abuse: counseling provided, nicotine patch used during admission. Procedures:  2-D echo with normal LVF, no pulmonary HTN and normal RV and RA.  Consultations: Cardiology  Discharge Exam: Filed Vitals:   03/05/13 0300 03/05/13 0400 03/05/13 0824 03/05/13 0825  BP:  87/55 105/58 105/58  Pulse:  77 74 67  Temp: 98.4 F (36.9 C)  98.5 F (36.9 C)   TempSrc: Oral  Oral   Resp:  14 24 14   Height:      Weight:      SpO2:  98% 99% 96%    General: Well developed, well nourished, in no acute distress  Head: Eyes PERRLA, No xanthomas. Normal cephalic and atramatic  Lungs: Clear bilaterally to auscultation and percussion.  Heart: HRRR S1 S2 Pulses are 2+ & equal.  Abdomen: Bowel sounds are positive, abdomen soft and non-tender without masses  Extremities: No clubbing, cyanosis or edema.  Neuro: Alert and oriented X 3. No focal motor or sensory deficit appreciated    Discharge Instructions  Discharge Orders   Future Orders Complete By Expires     Diet - low sodium heart healthy  As directed  Discharge instructions  As directed     Comments:      -TAKE MEDICATIONS AS PRESCRIBED -KEEP YOURSELF WELL HYDRATED -FOLLOW WITH CARDIOLOGY SERVICE ON Monday (Dr. Katrinka Blazing) -STOP LISINOPRIL UNTIL YOU FOLLOW WITH CARDIOLOGY SERVICE -FOLLOW A HEART HEALTHY DIET (LOW SODIUM)        Medication List    TAKE these medications       albuterol 108 (90 BASE) MCG/ACT inhaler  Commonly known as:  PROVENTIL HFA;VENTOLIN HFA  Inhale 2 puffs into the lungs every 6 (six) hours as needed for wheezing or  shortness of breath.     lisinopril 2.5 MG tablet  Commonly known as:  PRINIVIL,ZESTRIL  Take 1 tablet (2.5 mg total) by mouth daily. HOLD UNTIL YOU FOLLOW WITH CARDIOLOGY ON mONDAY     multivitamin with minerals Tabs  Take 1 tablet by mouth every morning.     XARELTO 20 MG Tabs  Generic drug:  Rivaroxaban  Take 20 mg by mouth daily.       Allergies  Allergen Reactions  . Lisinopril Swelling  . Tramadol Nausea And Vomiting       Follow-up Information   Follow up with Malik Noe, MD On 03/07/2013. (call office for appointment)    Contact information:   [address] [place] [address] [phone]        The results of significant diagnostics from this hospitalization (including imaging, microbiology, ancillary and laboratory) are listed below for reference.    Significant Diagnostic Studies: Dg Chest 2 View  03/03/2013   *RADIOLOGY REPORT*  Clinical Data: Pain, syncope  CHEST - 2 VIEW  Comparison: Prior chest x-ray 10/27/2012  Findings: Similar appearance of the chest with pulmonary hyperexpansion, prominence of the lower lobe interstitial markings and central bronchitic changes.  Cardiac and mediastinal contours remain unchanged within normal limits.  Degenerative changes noted at both glenohumeral joints.  The humeral heads are high-riding suggesting chronic rotator cuff injuries.  No acute osseous abnormality.  Multilevel degenerative changes throughout the visualized spine.  IMPRESSION:  1.  No acute cardiopulmonary process. 2.  COPD.   Original Report Authenticated By: Malachy Moan, M.D.   Dg Wrist Complete Left  03/03/2013   *RADIOLOGY REPORT*  Clinical Data: Pain post fall.  LEFT WRIST - COMPLETE 3+ VIEW  Comparison: None.  Findings: Degenerative changes of distal radioulnar joint with a ring of osteophytes around the distal ulna.  Carpal rows intact. Negative for fracture, dislocation, or other acute abnormality. Normal alignment and  mineralization. No other significant degenerative change.  Regional soft tissues unremarkable.  IMPRESSION:  1.  Negative for fracture or other acute bony abnormality. 2. DRUJ osteoarthritis.   Original Report Authenticated By: D. Andria Rhein, MD   Ct Head Wo Contrast  03/03/2013   *RADIOLOGY REPORT*  Clinical Data: Loss of consciousness with dizziness and blurred vision. History of hypertension.  CT HEAD WITHOUT CONTRAST  Technique:  Contiguous axial images were obtained from the base of the skull through the vertex without contrast.  Comparison: None.  Findings: There is no evidence for acute infarction, intracranial hemorrhage, mass lesion, hydrocephalus, or extra-axial fluid. There is no atrophy or white matter disease.  No large vessel infarct is seen.  The calvarium is intact.  Negative orbits.  No mastoid fluid.  Moderate fluid accumulation in the bilateral maxillary sinuses with air-fluid levels suggesting acuity.  Moderate bilateral ethmoid air cell fluid is noted as well.  IMPRESSION: No acute intracranial findings.  Acute bilateral  maxillary sinusitis.  Bilateral ethmoid sinus disease is also present.   Original Report Authenticated By: Davonna Belling, M.D.    Microbiology: Recent Results (from the past 240 hour(s))  MRSA PCR SCREENING     Status: None   Collection Time    03/04/13 12:55 PM      Result Value Range Status   MRSA by PCR NEGATIVE  NEGATIVE Final   Comment:            The GeneXpert MRSA Assay (FDA     approved for NASAL specimens     only), is one component of a     comprehensive MRSA colonization     surveillance program. It is not     intended to diagnose MRSA     infection nor to guide or     monitor treatment for     MRSA infections.     Labs: Basic Metabolic Panel:  Recent Labs Lab 03/03/13 1426 03/04/13 0651 03/05/13 0720  NA 134* 134* 138  K 3.5 3.4* 3.8  CL 98 102 104  CO2 19 24 25   GLUCOSE 92 109* 105*  BUN 40* 24* 15  CREATININE 3.69* 1.80* 1.42*   CALCIUM 9.9 8.6 8.7  MG  --  2.1  --    Liver Function Tests:  Recent Labs Lab 03/03/13 1426  AST 37  ALT 20  ALKPHOS 99  BILITOT 0.6  PROT 7.7  ALBUMIN 3.7   CBC:  Recent Labs Lab 03/03/13 1426 03/04/13 0651 03/05/13 0720  WBC 9.5 6.5 6.1  NEUTROABS 7.2  --   --   HGB 14.9 13.4 13.7  HCT 44.1 40.5 41.8  MCV 79.9 80.7 82.1  PLT 235 211 188   Cardiac Enzymes:  Recent Labs Lab 03/03/13 1426 03/03/13 1840 03/04/13 0013 03/04/13 0651  CKTOTAL 967*  --   --   --   TROPONINI  --  <0.30 <0.30 <0.30    Signed:  Aleksis Jiggetts  [facility] 03/05/2013, 11:06 AM

## 2013-04-06 ENCOUNTER — Emergency Department (HOSPITAL_COMMUNITY): Payer: Medicaid Other

## 2013-04-06 ENCOUNTER — Encounter (HOSPITAL_COMMUNITY): Payer: Self-pay | Admitting: Emergency Medicine

## 2013-04-06 ENCOUNTER — Emergency Department (HOSPITAL_COMMUNITY)
Admission: EM | Admit: 2013-04-06 | Discharge: 2013-04-06 | Disposition: A | Discharge status: Home / Self Care | Payer: Medicaid Other | Attending: Emergency Medicine | Admitting: Emergency Medicine

## 2013-04-06 DIAGNOSIS — Z8701 Personal history of pneumonia (recurrent): Secondary | ICD-10-CM | POA: Insufficient documentation

## 2013-04-06 DIAGNOSIS — S59909A Unspecified injury of unspecified elbow, initial encounter: Secondary | ICD-10-CM | POA: Insufficient documentation

## 2013-04-06 DIAGNOSIS — S4980XA Other specified injuries of shoulder and upper arm, unspecified arm, initial encounter: Secondary | ICD-10-CM | POA: Insufficient documentation

## 2013-04-06 DIAGNOSIS — W19XXXA Unspecified fall, initial encounter: Secondary | ICD-10-CM

## 2013-04-06 DIAGNOSIS — R296 Repeated falls: Secondary | ICD-10-CM | POA: Insufficient documentation

## 2013-04-06 DIAGNOSIS — I10 Essential (primary) hypertension: Secondary | ICD-10-CM | POA: Insufficient documentation

## 2013-04-06 DIAGNOSIS — S59902A Unspecified injury of left elbow, initial encounter: Secondary | ICD-10-CM

## 2013-04-06 DIAGNOSIS — S4992XA Unspecified injury of left shoulder and upper arm, initial encounter: Secondary | ICD-10-CM

## 2013-04-06 DIAGNOSIS — Z86718 Personal history of other venous thrombosis and embolism: Secondary | ICD-10-CM | POA: Insufficient documentation

## 2013-04-06 DIAGNOSIS — Y9389 Activity, other specified: Secondary | ICD-10-CM | POA: Insufficient documentation

## 2013-04-06 DIAGNOSIS — Z8739 Personal history of other diseases of the musculoskeletal system and connective tissue: Secondary | ICD-10-CM | POA: Insufficient documentation

## 2013-04-06 DIAGNOSIS — S46909A Unspecified injury of unspecified muscle, fascia and tendon at shoulder and upper arm level, unspecified arm, initial encounter: Secondary | ICD-10-CM | POA: Insufficient documentation

## 2013-04-06 DIAGNOSIS — S6990XA Unspecified injury of unspecified wrist, hand and finger(s), initial encounter: Secondary | ICD-10-CM | POA: Insufficient documentation

## 2013-04-06 DIAGNOSIS — Y929 Unspecified place or not applicable: Secondary | ICD-10-CM | POA: Insufficient documentation

## 2013-04-06 MED ORDER — OXYCODONE-ACETAMINOPHEN 5-325 MG PO TABS
1.0000 | ORAL_TABLET | Freq: Once | ORAL | Status: AC
  Administered 2013-04-06: 1 via ORAL
  Filled 2013-04-06: qty 1

## 2013-04-06 MED ORDER — HYDROCODONE-ACETAMINOPHEN 5-325 MG PO TABS: 1.0000 | ORAL_TABLET | Freq: Four times a day (QID) | ORAL | Status: DC | PRN

## 2013-04-06 NOTE — ED Provider Notes (Signed)
Medical screening examination/treatment/procedure(s) were performed by non-physician practitioner and as supervising physician I was immediately available for consultation/collaboration.  Timera Windt M Eris Hannan, MD 04/06/13 1609 

## 2013-04-06 NOTE — ED Provider Notes (Signed)
History    CSN: [id number] Arrival date & time 04/06/13  0729  First MD Initiated Contact with Patient 04/06/13 [phone]     Chief Complaint  Patient presents with  . Arm Pain   (Consider location/radiation/quality/duration/timing/severity/associated sxs/prior Treatment) HPI Malik Mullins is a 57 y.o. male who present to ED with complaint of left arm pain. Pt states he slipped on a deck and fell onto left arm. Pt reports pain in the left shoulder and left elbow. States pain worsened with movement and palpation. Pain is sharp radiating into the hand. Denies taking any medications for it. Denies numbness or weakness in the arm. No hx of prior injuries.   Past Medical History  Diagnosis Date  . Hypertension   . Back pain   . 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 Knee joint pain   . Pneumonia    Past Surgical History  Procedure Laterality Date  . Mandible surgery     Family History  Problem Relation Age of Onset  . Hypertension Mother   . Diabetes Father    History  Substance Use Topics  . Smoking status: Current Some Day Smoker -- 0.30 packs/day for 40 years    Types: Cigarettes  . Smokeless tobacco: Never Used  . Alcohol Use: Yes     Comment: "from time to time"    Review of Systems  Constitutional: Negative for fever and chills.  Respiratory: Negative.   Cardiovascular: Negative.   Gastrointestinal: Negative for nausea, vomiting and abdominal pain.  Genitourinary: Negative for dysuria and flank pain.  Musculoskeletal: Positive for joint swelling and arthralgias.  Skin: Negative.   Neurological: Negative for weakness and numbness.    Allergies  Lisinopril and Tramadol  Home Medications   Current Outpatient Rx  Name  Route  Sig  Dispense  Refill  . albuterol (PROVENTIL HFA;VENTOLIN HFA) 108 (90 BASE) MCG/ACT inhaler   Inhalation   Inhale 2 puffs into the lungs every 6 (six) hours as needed for wheezing or shortness of breath.           . Multiple Vitamin (MULTIVITAMIN WITH MINERALS) TABS   Oral   Take 1 tablet by mouth every morning.          . Rivaroxaban (XARELTO) 20 MG TABS   Oral   Take 20 mg by mouth every morning.           BP 123/74  Pulse 90  Temp(Src) 97.4 F (36.3 C) (Oral)  Resp 17  SpO2 98% Physical Exam  Nursing note and vitals reviewed. Constitutional: He appears well-developed and well-nourished. No distress.  Eyes: Conjunctivae are normal.  Cardiovascular: Normal rate, regular rhythm and normal heart sounds.   Pulmonary/Chest: Effort normal and breath sounds normal. No respiratory distress. He has no wheezes. He has no rales.  Musculoskeletal: He exhibits no edema.  Tenderness over anterior and posterior shoulder. Tenderness over medial and lateral elbow joints. Pain with ROM of the left shoulder in any directions. Pain with flexion and extension of left elbow, full ROM of the elbow. Normal wrist. Distal radial pulses intact. Grip strength  normal. No swelling, bruising, erythema over left arm  Neurological: He is alert.  Skin: Skin is warm and dry.    ED Course  Procedures (including critical care time) Labs Reviewed - No data to display  Dg Elbow Complete Left  04/06/2013   *RADIOLOGY REPORT*  Clinical Data: Status post fall 1 day ago.  Left elbow pain.  LEFT ELBOW - COMPLETE 3+ VIEW  Comparison: None.  Findings: No acute bony or joint abnormality is identified.  There is no joint effusion.  There is some degenerative change about the elbow with an osteophyte seen off the coronoid process.  Mild enthesopathic change at the triceps tendon insertion is noted.  IMPRESSION: No acute finding.   Original Report Authenticated By: Holley Dexter, M.D.   Dg Shoulder Left  04/06/2013   *RADIOLOGY REPORT*  Clinical Data: Severe pain since a fall yesterday.  Limited range of motion.  LEFT SHOULDER - 2+ VIEW  Comparison: None.  Findings: There is no fracture or dislocation.  There is moderately  severe arthritis of the acromioclavicular joint with narrowing of the subacromial space consistent with rotator cuff disease. Multiple old healed left posterior lateral rib fractures.  IMPRESSION: No acute abnormality.  Degenerative changes as described.   Original Report Authenticated By: Francene Boyers, M.D.   1. Shoulder injury, left, initial encounter   2. Elbow injury, left, initial encounter   3. Fall, initial encounter     MDM  Pt post mechanical fall. Pain to the left shoulder and elbow. Pain with movement of left arm. xrays negative. No swelling. No numbness or weakness. Home with sling, pain medications, follow up with orthopedics.   Filed Vitals:   04/06/13 0740  BP: 123/74  Pulse: 90  Temp: 97.4 F (36.3 C)  TempSrc: Oral  Resp: 17  SpO2: 98%       Mary Hockey A Haydn Cush, PA-C 04/06/13 1608

## 2013-04-06 NOTE — ED Notes (Signed)
Pt states that he tripped and fell yesterday and landed on his left arm.  C/o left arm pain from shoulder to hand.

## 2014-04-21 DIAGNOSIS — F4323 Adjustment disorder with mixed anxiety and depressed mood: Secondary | ICD-10-CM | POA: Diagnosis not present

## 2014-04-21 DIAGNOSIS — F323 Major depressive disorder, single episode, severe with psychotic features: Secondary | ICD-10-CM | POA: Diagnosis not present

## 2014-05-01 DIAGNOSIS — Z131 Encounter for screening for diabetes mellitus: Secondary | ICD-10-CM | POA: Diagnosis not present

## 2014-05-01 DIAGNOSIS — Z125 Encounter for screening for malignant neoplasm of prostate: Secondary | ICD-10-CM | POA: Diagnosis not present

## 2014-05-01 DIAGNOSIS — F3289 Other specified depressive episodes: Secondary | ICD-10-CM | POA: Diagnosis not present

## 2014-05-01 DIAGNOSIS — I10 Essential (primary) hypertension: Secondary | ICD-10-CM | POA: Diagnosis not present

## 2014-05-01 DIAGNOSIS — I82409 Acute embolism and thrombosis of unspecified deep veins of unspecified lower extremity: Secondary | ICD-10-CM | POA: Diagnosis not present

## 2014-05-01 DIAGNOSIS — Z13220 Encounter for screening for lipoid disorders: Secondary | ICD-10-CM | POA: Diagnosis not present

## 2014-05-02 ENCOUNTER — Other Ambulatory Visit (HOSPITAL_COMMUNITY): Payer: Self-pay | Admitting: Internal Medicine

## 2014-05-02 ENCOUNTER — Encounter (HOSPITAL_COMMUNITY): Payer: Self-pay

## 2014-05-02 DIAGNOSIS — I82409 Acute embolism and thrombosis of unspecified deep veins of unspecified lower extremity: Secondary | ICD-10-CM

## 2014-05-03 ENCOUNTER — Ambulatory Visit (HOSPITAL_COMMUNITY): Payer: Medicare Other | Attending: Internal Medicine

## 2014-05-20 ENCOUNTER — Encounter: Payer: Self-pay | Admitting: *Deleted

## 2014-06-09 DIAGNOSIS — F4323 Adjustment disorder with mixed anxiety and depressed mood: Secondary | ICD-10-CM | POA: Diagnosis not present

## 2014-07-12 DIAGNOSIS — F4321 Adjustment disorder with depressed mood: Secondary | ICD-10-CM | POA: Diagnosis not present

## 2014-07-19 DIAGNOSIS — F4321 Adjustment disorder with depressed mood: Secondary | ICD-10-CM | POA: Diagnosis not present

## 2014-09-25 ENCOUNTER — Encounter (HOSPITAL_COMMUNITY): Payer: Self-pay | Admitting: Emergency Medicine

## 2014-09-25 ENCOUNTER — Emergency Department (HOSPITAL_COMMUNITY)
Admission: EM | Admit: 2014-09-25 | Discharge: 2014-09-25 | Disposition: A | Discharge status: Home / Self Care | Payer: Medicare Other | Attending: Emergency Medicine | Admitting: Emergency Medicine

## 2014-09-25 ENCOUNTER — Emergency Department (HOSPITAL_COMMUNITY): Payer: Medicare Other

## 2014-09-25 DIAGNOSIS — M79609 Pain in unspecified limb: Secondary | ICD-10-CM | POA: Diagnosis not present

## 2014-09-25 DIAGNOSIS — I440 Atrioventricular block, first degree: Secondary | ICD-10-CM | POA: Insufficient documentation

## 2014-09-25 DIAGNOSIS — R079 Chest pain, unspecified: Secondary | ICD-10-CM | POA: Diagnosis not present

## 2014-09-25 DIAGNOSIS — R05 Cough: Secondary | ICD-10-CM

## 2014-09-25 DIAGNOSIS — J069 Acute upper respiratory infection, unspecified: Secondary | ICD-10-CM | POA: Insufficient documentation

## 2014-09-25 DIAGNOSIS — Z720 Tobacco use: Secondary | ICD-10-CM | POA: Diagnosis not present

## 2014-09-25 DIAGNOSIS — Z86718 Personal history of other venous thrombosis and embolism: Secondary | ICD-10-CM | POA: Insufficient documentation

## 2014-09-25 DIAGNOSIS — I129 Hypertensive chronic kidney disease with stage 1 through stage 4 chronic kidney disease, or unspecified chronic kidney disease: Secondary | ICD-10-CM | POA: Diagnosis not present

## 2014-09-25 DIAGNOSIS — R0789 Other chest pain: Secondary | ICD-10-CM

## 2014-09-25 DIAGNOSIS — I82403 Acute embolism and thrombosis of unspecified deep veins of lower extremity, bilateral: Secondary | ICD-10-CM

## 2014-09-25 DIAGNOSIS — Z8701 Personal history of pneumonia (recurrent): Secondary | ICD-10-CM | POA: Insufficient documentation

## 2014-09-25 DIAGNOSIS — Z7901 Long term (current) use of anticoagulants: Secondary | ICD-10-CM | POA: Diagnosis not present

## 2014-09-25 DIAGNOSIS — N189 Chronic kidney disease, unspecified: Secondary | ICD-10-CM | POA: Diagnosis not present

## 2014-09-25 DIAGNOSIS — R059 Cough, unspecified: Secondary | ICD-10-CM

## 2014-09-25 DIAGNOSIS — R0602 Shortness of breath: Secondary | ICD-10-CM | POA: Diagnosis not present

## 2014-09-25 LAB — BASIC METABOLIC PANEL
Anion gap: 10 (ref 5–15)
BUN: 19 mg/dL (ref 6–23)
CO2: 20 mmol/L (ref 19–32)
Calcium: 8.8 mg/dL (ref 8.4–10.5)
Chloride: 107 mEq/L (ref 96–112)
Creatinine, Ser: 1.97 mg/dL — ABNORMAL HIGH (ref 0.50–1.35)
GFR calc Af Amer: 41 mL/min — ABNORMAL LOW (ref >90)
GFR calc non Af Amer: 35 mL/min — ABNORMAL LOW (ref >90)
Glucose, Bld: 120 mg/dL — ABNORMAL HIGH (ref 70–99)
POTASSIUM: 3.4 mmol/L — AB (ref 3.5–5.1)
SODIUM: 137 mmol/L (ref 135–145)

## 2014-09-25 LAB — CBC WITH DIFFERENTIAL/PLATELET
Basophils Absolute: 0 10*3/uL (ref 0.0–0.1)
Basophils Relative: 1 % (ref 0–1)
EOS PCT: 5 % (ref 0–5)
Eosinophils Absolute: 0.3 10*3/uL (ref 0.0–0.7)
HEMATOCRIT: 46.9 % (ref 39.0–52.0)
Hemoglobin: 15.2 g/dL (ref 13.0–17.0)
Lymphocytes Relative: 14 % (ref 12–46)
Lymphs Abs: 0.7 10*3/uL (ref 0.7–4.0)
MCH: 26.9 pg (ref 26.0–34.0)
MCHC: 32.4 g/dL (ref 30.0–36.0)
MCV: 82.9 fL (ref 78.0–100.0)
Monocytes Absolute: 0.6 10*3/uL (ref 0.1–1.0)
Monocytes Relative: 12 % (ref 3–12)
NEUTROS PCT: 68 % (ref 43–77)
Neutro Abs: 3.6 10*3/uL (ref 1.7–7.7)
Platelets: 164 10*3/uL (ref 150–400)
RBC: 5.66 MIL/uL (ref 4.22–5.81)
RDW: 14.2 % (ref 11.5–15.5)
WBC: 5.2 10*3/uL (ref 4.0–10.5)

## 2014-09-25 LAB — TROPONIN I

## 2014-09-25 LAB — D-DIMER, QUANTITATIVE: D-Dimer, Quant: 0.39 ug/mL-FEU (ref 0.00–0.48)

## 2014-09-25 LAB — BRAIN NATRIURETIC PEPTIDE: B NATRIURETIC PEPTIDE 5: 14 pg/mL (ref 0.0–100.0)

## 2014-09-25 LAB — I-STAT TROPONIN, ED: TROPONIN I, POC: 0.01 ng/mL (ref 0.00–0.08)

## 2014-09-25 NOTE — ED Notes (Signed)
Pt given ice water.

## 2014-09-25 NOTE — ED Provider Notes (Signed)
CSN: [id number]     Arrival date & time 09/25/14  1324 History   First MD Initiated Contact with Patient 09/25/14 [phone]     Chief Complaint  Patient presents with  . Cough     (Consider location/radiation/quality/duration/timing/severity/associated sxs/prior Treatment) Patient is a 59 y.o. male presenting with cough. The history is provided by the patient and a relative. No language interpreter was used.  Cough Cough characteristics:  Productive Sputum characteristics:  Frothy and white Severity:  Moderate Duration:  10 days Timing:  Constant Progression:  Unchanged Chronicity:  New Smoker: yes   Context: upper respiratory infection   Context: not sick contacts   Relieved by:  Nothing Worsened by:  Nothing tried Ineffective treatments:  None tried Associated symptoms: chest pain, ear fullness, fever, myalgias, rhinorrhea, shortness of breath and sinus congestion   Associated symptoms: no diaphoresis, no headaches, no rash, no sore throat and no wheezing   Chest pain:    Quality:  Aching   Severity:  Mild   Duration:  10 days   Timing:  Intermittent   Progression:  Waxing and waning (only w/ cough)   Chronicity:  New Fever:    Temp source:  Subjective   Progression:  Waxing and waning Myalgias:    Location:  Generalized   Quality:  Aching   Severity:  Moderate   Duration:  10 days   Timing:  Intermittent   Progression:  Waxing and waning Shortness of breath:    Severity:  Mild   Duration:  10 days   Timing:  Intermittent   Progression:  Waxing and waning Risk factors: recent infection   Risk factors: no recent travel   Risk factors comment:  Hx of BL DVTs   Past Medical History  Diagnosis Date  . Hypertension   . Back pain   . 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 Knee joint pain   . Pneumonia    Past Surgical History  Procedure Laterality Date  . Mandible surgery     Family History  Problem Relation Age of Onset  . Hypertension  Mother   . Diabetes Father    History  Substance Use Topics  . Smoking status: Current Some Day Smoker -- 0.30 packs/day for 40 years    Types: Cigarettes  . Smokeless tobacco: Never Used  . Alcohol Use: Yes     Comment: "from time to time"    Review of Systems  Constitutional: Positive for fever. Negative for diaphoresis, activity change, appetite change and fatigue.  HENT: Positive for congestion and rhinorrhea. Negative for facial swelling, sore throat and trouble swallowing.   Eyes: Negative for photophobia and pain.  Respiratory: Positive for cough and shortness of breath. Negative for chest tightness and wheezing.   Cardiovascular: Positive for chest pain. Negative for leg swelling.  Gastrointestinal: Negative for nausea, vomiting, abdominal pain, diarrhea and constipation.  Endocrine: Negative for polydipsia and polyuria.  Genitourinary: Negative for dysuria, urgency, decreased urine volume and difficulty urinating.  Musculoskeletal: Positive for myalgias. Negative for back pain and gait problem.  Skin: Negative for color change, rash and wound.  Allergic/Immunologic: Negative for immunocompromised state.  Neurological: Negative for dizziness, facial asymmetry, speech difficulty, weakness, numbness and headaches.  Psychiatric/Behavioral: Negative for confusion, decreased concentration and agitation.      Allergies  Lisinopril and Tramadol  Home Medications   Prior to Admission medications   Medication Sig Start Date End Date Taking? Authorizing Provider  albuterol (PROVENTIL HFA;VENTOLIN HFA) 108 (90 BASE) MCG/ACT inhaler Inhale 2 puffs into the lungs every 6 (six) hours as needed for wheezing or shortness of breath.     Historical Provider, MD  HYDROcodone-acetaminophen (NORCO) 5-325 MG per tablet Take 1 tablet by mouth every 6 (six) hours as needed. 04/06/13   Tatyana A Kirichenko, PA-C  Multiple Vitamin (MULTIVITAMIN WITH MINERALS) TABS Take 1 tablet by mouth every  morning.     Historical Provider, MD  Rivaroxaban (XARELTO) 20 MG TABS Take 20 mg by mouth every morning.     Historical Provider, MD   BP 107/73 mmHg  Pulse 65  Temp(Src) 98.5 F (36.9 C) (Oral)  Resp 16  Ht  (1.854 m)  Wt 235 lb (106.595 kg)  BMI 31.01 kg/m2  SpO2 95% Physical Exam  Constitutional: He is oriented to person, place, and time. He appears well-developed and well-nourished. No distress.  HENT:  Head: Normocephalic and atraumatic.  Mouth/Throat: No oropharyngeal exudate.  Eyes: Pupils are equal, round, and reactive to light.  Neck: Normal range of motion. Neck supple.  Cardiovascular: Normal rate, regular rhythm and normal heart sounds.  Exam reveals no gallop and no friction rub.   No murmur heard. Pulmonary/Chest: Effort normal. No respiratory distress. He has no wheezes. He has rales in the right lower field.  Abdominal: Soft. Bowel sounds are normal. He exhibits no distension and no mass. There is no tenderness. There is no rebound and no guarding.  Musculoskeletal: Normal range of motion. He exhibits no edema or tenderness.  Neurological: He is alert and oriented to person, place, and time.  Skin: Skin is warm and dry.  Psychiatric: He has a normal mood and affect.    ED Course  Procedures (including critical care time) Labs Review Labs Reviewed  BASIC METABOLIC PANEL - Abnormal; Notable for the following:    Potassium 3.4 (*)    Glucose, Bld 120 (*)    Creatinine, Ser 1.97 (*)    GFR calc non Af Amer 35 (*)    GFR calc Af Amer 41 (*)    All other components within normal limits  CBC WITH DIFFERENTIAL  BRAIN NATRIURETIC PEPTIDE  D-DIMER, QUANTITATIVE  TROPONIN I  CBC  I-STAT TROPOININ, ED  I-STAT TROPOININ, ED    Imaging Review Dg Chest 2 View (if Patient Has Fever And/or Copd)  09/25/2014   CLINICAL DATA:  Short of breath since 2 days ago, worsening this morning. Smoking history.  EXAM: CHEST  2 VIEW  COMPARISON:  03/03/2013.  10/27/2012.   06/01/2009.  FINDINGS: Heart size is normal. There is calcification of the aorta. Chronic pulmonary scarring remains evident, particularly notable to the bases. No visible active infiltrate, mass, effusion or collapse. The may be central bronchial thickening. Chronic degenerative changes affect the spine.  IMPRESSION: No definite active process. Chronic pulmonary scarring. Question central bronchial thickening, not definite.   Electronically Signed   By: Paulina Fusi M.D.   On: 09/25/2014 07:34     EKG Interpretation   Date/Time:  Monday September 25 2014 07:35:27 EST Ventricular Rate:  72 PR Interval:  253 QRS Duration: 99 QT Interval:  372 QTC Calculation: 407 R Axis:   -18 Text Interpretation:  Sinus rhythm Atrial premature complex Sinus pause  Prolonged PR interval Borderline left axis deviation Borderline T wave  abnormalities Confirmed by Henery Betzold  MD, Jarryn Altland ([id number]) on 09/25/2014 7:57:40  AM      MDM   Final diagnoses:  Cough  URI (upper respiratory infection)  Atypical chest pain  First degree AV block  Chronic renal insufficiency, unspecified stage    Pt is a 59 y.o. male with Pmhx as above who presents with about 10 days of cough, congestion, subjective fever.chills, myalgias, BL lower leg pain, SOB, central chest pain w/ cough only. On PE, VSS, pt in NAD. +rales RLL, +BL calf ttp w/o appreciated edema. EKG sinus w/ first deg AVB followed by PAC and sinus pause.   CXR with scarring, no acute findings. BNP, trop not elevated. WBC nml. D-dimer nml, no DVTs on vasc study Bilaterally. Cr elevated though looks like has been fluctuating recently. 3hr delta trop also negative. Spoke w/ Dr. Rennis GoldenHilty w/ cardiology who reviewed EKG (agreed with my read), rec outpt f/u with Dr. Katrinka BlazingSmith who he has seen in the past, rec we try to expedite the sleep study that was recommended during last admission. Hx atypical for ACS, doubt PE given no DVTs, nml o2 sats, nml HR, nml d-dimer. He can discuss  restarting anticoagulants w/ his PCP.     Lollie SailsHarry Turner evaluation in the Emergency Department is complete. It has been determined that no acute conditions requiring further emergency intervention are present at this time. The patient/guardian have been advised of the diagnosis and plan. We have discussed signs and symptoms that warrant return to the ED, such as changes or worsening in symptoms, worsening pain, SOB, leg swelling.       Toy CookeyMegan Darolyn Double, MD 09/25/14 [phone]

## 2014-09-25 NOTE — ED Notes (Signed)
Patient transported to X-ray 

## 2014-09-25 NOTE — Discharge Instructions (Signed)
°Chest Pain (Nonspecific) °It is often hard to give a specific diagnosis for the cause of chest pain. There is always a chance that your pain could be related to something serious, such as a heart attack or a blood clot in the lungs. You need to follow up with your health care provider for further evaluation. °CAUSES  °· Heartburn. °· Pneumonia or bronchitis. °· Anxiety or stress. °· Inflammation around your heart (pericarditis) or lung (pleuritis or pleurisy). °· A blood clot in the lung. °· A collapsed lung (pneumothorax). It can develop suddenly on its own (spontaneous pneumothorax) or from trauma to the chest. °· Shingles infection (herpes zoster virus). °The chest wall is composed of bones, muscles, and cartilage. Any of these can be the source of the pain. °· The bones can be bruised by injury. °· The muscles or cartilage can be strained by coughing or overwork. °· The cartilage can be affected by inflammation and become sore (costochondritis). °DIAGNOSIS  °Lab tests or other studies may be needed to find the cause of your pain. Your health care provider may have you take a test called an ambulatory electrocardiogram (ECG). An ECG records your heartbeat patterns over a 24-hour period. You may also have other tests, such as: °· Transthoracic echocardiogram (TTE). During echocardiography, sound waves are used to evaluate how blood flows through your heart. °· Transesophageal echocardiogram (TEE). °· Cardiac monitoring. This allows your health care provider to monitor your heart rate and rhythm in real time. °· Holter monitor. This is a portable device that records your heartbeat and can help diagnose heart arrhythmias. It allows your health care provider to track your heart activity for several days, if needed. °· Stress tests by exercise or by giving medicine that makes the heart beat faster. °TREATMENT  °· Treatment depends on what may be causing your chest pain. Treatment may include: °¨ Acid blockers for  heartburn. °¨ Anti-inflammatory medicine. °¨ Pain medicine for inflammatory conditions. °¨ Antibiotics if an infection is present. °· You may be advised to change lifestyle habits. This includes stopping smoking and avoiding alcohol, caffeine, and chocolate. °· You may be advised to keep your head raised (elevated) when sleeping. This reduces the chance of acid going backward from your stomach into your esophagus. °Most of the time, nonspecific chest pain will improve within 2-3 days with rest and mild pain medicine.  °HOME CARE INSTRUCTIONS  °· If antibiotics were prescribed, take them as directed. Finish them even if you start to feel better. °· For the next few days, avoid physical activities that bring on chest pain. Continue physical activities as directed. °· Do not use any tobacco products, including cigarettes, chewing tobacco, or electronic cigarettes. °· Avoid drinking alcohol. °· Only take medicine as directed by your health care provider. °· Follow your health care provider's suggestions for further testing if your chest pain does not go away. °· Keep any follow-up appointments you made. If you do not go to an appointment, you could develop lasting (chronic) problems with pain. If there is any problem keeping an appointment, call to reschedule. °SEEK MEDICAL CARE IF:  °· Your chest pain does not go away, even after treatment. °· You have a rash with blisters on your chest. °· You have a fever. °SEEK IMMEDIATE MEDICAL CARE IF:  °· You have increased chest pain or pain that spreads to your arm, neck, jaw, back, or abdomen. °· You have shortness of breath. °· You have an increasing cough, or you cough   up blood.  You have severe back or abdominal pain.  You feel nauseous or vomit.  You have severe weakness.  You faint.  You have chills. This is an emergency. Do not wait to see if the pain will go away. Get medical help at once. Call your local emergency services (911 in [place] Do not drive  yourself to the hospital. MAKE SURE YOU:   Understand these instructions.  Will watch your condition.  Will get help right away if you are not doing well or get worse. Document Released: 06/11/2005 Document Revised: 09/06/2013 Document Reviewed: 04/06/2008 [facility] Patient Information 2015 [place], [place]. This information is not intended to replace advice given to you by your health care provider. Make sure you discuss any questions you have with your health care provider.   Upper Respiratory Infection, Adult An upper respiratory infection (URI) is also sometimes known as the common cold. The upper respiratory tract includes the nose, sinuses, throat, trachea, and bronchi. Bronchi are the airways leading to the lungs. Most people improve within 1 week, but symptoms can last up to 2 weeks. A residual cough may last even longer.  CAUSES Many different viruses can infect the tissues lining the upper respiratory tract. The tissues become irritated and inflamed and often become very moist. Mucus production is also common. A cold is contagious. You can easily spread the virus to others by oral contact. This includes kissing, sharing a glass, coughing, or sneezing. Touching your mouth or nose and then touching a surface, which is then touched by another person, can also spread the virus. SYMPTOMS  Symptoms typically develop 1 to 3 days after you come in contact with a cold virus. Symptoms vary from person to person. They may include:  Runny nose.  Sneezing.  Nasal congestion.  Sinus irritation.  Sore throat.  Loss of voice (laryngitis).  Cough.  Fatigue.  Muscle aches.  Loss of appetite.  Headache.  Low-grade fever. DIAGNOSIS  You might diagnose your own cold based on familiar symptoms, since most people get a cold 2 to 3 times a year. Your caregiver can confirm this based on your exam. Most importantly, your caregiver can check that your symptoms are not due to another disease  such as strep throat, sinusitis, pneumonia, asthma, or epiglottitis. Blood tests, throat tests, and X-rays are not necessary to diagnose a common cold, but they may sometimes be helpful in excluding other more serious diseases. Your caregiver will decide if any further tests are required. RISKS AND COMPLICATIONS  You may be at risk for a more severe case of the common cold if you smoke cigarettes, have chronic heart disease (such as heart failure) or lung disease (such as asthma), or if you have a weakened immune system. The very young and very old are also at risk for more serious infections. Bacterial sinusitis, middle ear infections, and bacterial pneumonia can complicate the common cold. The common cold can worsen asthma and chronic obstructive pulmonary disease (COPD). Sometimes, these complications can require emergency medical care and may be life-threatening. PREVENTION  The best way to protect against getting a cold is to practice good hygiene. Avoid oral or hand contact with people with cold symptoms. Wash your hands often if contact occurs. There is no clear evidence that vitamin C, vitamin E, echinacea, or exercise reduces the chance of developing a cold. However, it is always recommended to get plenty of rest and practice good nutrition. TREATMENT  Treatment is directed at relieving symptoms. There is  no cure. Antibiotics are not effective, because the infection is caused by a virus, not by bacteria. Treatment may include:  Increased fluid intake. Sports drinks offer valuable electrolytes, sugars, and fluids.  Breathing heated mist or steam (vaporizer or shower).  Eating chicken soup or other clear broths, and maintaining good nutrition.  Getting plenty of rest.  Using gargles or lozenges for comfort.  Controlling fevers with ibuprofen or acetaminophen as directed by your caregiver.  Increasing usage of your inhaler if you have asthma. Zinc gel and zinc lozenges, taken in the first  24 hours of the common cold, can shorten the duration and lessen the severity of symptoms. Pain medicines may help with fever, muscle aches, and throat pain. A variety of non-prescription medicines are available to treat congestion and runny nose. Your caregiver can make recommendations and may suggest nasal or lung inhalers for other symptoms.  HOME CARE INSTRUCTIONS   Only take over-the-counter or prescription medicines for pain, discomfort, or fever as directed by your caregiver.  Use a warm mist humidifier or inhale steam from a shower to increase air moisture. This may keep secretions moist and make it easier to breathe.  Drink enough water and fluids to keep your urine clear or pale yellow.  Rest as needed.  Return to work when your temperature has returned to normal or as your caregiver advises. You may need to stay home longer to avoid infecting others. You can also use a face mask and careful hand washing to prevent spread of the virus. SEEK MEDICAL CARE IF:   After the first few days, you feel you are getting worse rather than better.  You need your caregiver's advice about medicines to control symptoms.  You develop chills, worsening shortness of breath, or brown or red sputum. These may be signs of pneumonia.  You develop yellow or brown nasal discharge or pain in the face, especially when you bend forward. These may be signs of sinusitis.  You develop a fever, swollen neck glands, pain with swallowing, or white areas in the back of your throat. These may be signs of strep throat. SEEK IMMEDIATE MEDICAL CARE IF:   You have a fever.  You develop severe or persistent headache, ear pain, sinus pain, or chest pain.  You develop wheezing, a prolonged cough, cough up blood, or have a change in your usual mucus (if you have chronic lung disease).  You develop sore muscles or a stiff neck. Document Released: 02/25/2001 Document Revised: 11/24/2011 Document Reviewed:  12/07/2013 [facility] Patient Information 2015 [place], [place]. This information is not intended to replace advice given to you by your health care provider. Make sure you discuss any questions you have with your health care provider.   Chronic Kidney Disease Chronic kidney disease occurs when the kidneys are damaged over a long period. The kidneys are two organs that lie on either side of the spine between the middle of the back and the front of the abdomen. The kidneys:   Remove wastes and extra water from the blood.   Produce important hormones. These help keep bones strong, regulate blood pressure, and help create red blood cells.   Balance the fluids and chemicals in the blood and tissues. A small amount of kidney damage may not cause problems, but a large amount of damage may make it difficult or impossible for the kidneys to work the way they should. If steps are not taken to slow down the kidney damage or stop it from getting  worse, the kidneys may stop working permanently. Most of the time, chronic kidney disease does not go away. However, it can often be controlled, and those with the disease can usually live normal lives. CAUSES  The most common causes of chronic kidney disease are diabetes and high blood pressure (hypertension). Chronic kidney disease may also be caused by:   Diseases that cause the kidneys' filters to become inflamed.   Diseases that affect the immune system.   Genetic diseases.   Medicines that damage the kidneys, such as anti-inflammatory medicines.  Poisoning or exposure to toxic substances.   A reoccurring kidney or urinary infection.   A problem with urine flow. This may be caused by:   Cancer.   Kidney stones.   An enlarged prostate in males. SIGNS AND SYMPTOMS  Because the kidney damage in chronic kidney disease occurs slowly, symptoms develop slowly and may not be obvious until the kidney damage becomes severe. A person may have a  kidney disease for years without showing any symptoms. Symptoms can include:   Swelling (edema) of the legs, ankles, or feet.   Tiredness (lethargy).   Nausea or vomiting.   Confusion.   Problems with urination, such as:   Decreased urine production.   Frequent urination, especially at night.   Frequent accidents in children who are potty trained.   Muscle twitches and cramps.   Shortness of breath.  Weakness.   Persistent itchiness.   Loss of appetite.  Metallic taste in the mouth.  Trouble sleeping.  Slowed development in children.  Short stature in children. DIAGNOSIS  Chronic kidney disease may be detected and diagnosed by tests, including blood, urine, imaging, or kidney biopsy tests.  TREATMENT  Most chronic kidney diseases cannot be cured. Treatment usually involves relieving symptoms and preventing or slowing the progression of the disease. Treatment may include:   A special diet. You may need to avoid alcohol and foods thatare salty and high in potassium.   Medicines. These may:   Lower blood pressure.   Relieve anemia.   Relieve swelling.   Protect the bones. HOME CARE INSTRUCTIONS   Follow your prescribed diet.   Take medicines only as directed by your health care provider. Do not take any new medicines (prescription, over-the-counter, or nutritional supplements) unless approved by your health care provider. Many medicines can worsen your kidney damage or need to have the dose adjusted.   Quit smoking if you smoke. Talk to your health care provider about a smoking cessation program.   Keep all follow-up visits as directed by your health care provider. SEEK IMMEDIATE MEDICAL CARE IF:  Your symptoms get worse or you develop new symptoms.   You develop symptoms of end-stage kidney disease. These include:   Headaches.   Abnormally dark or light skin.   Numbness in the hands or feet.   Easy bruising.   Frequent  hiccups.   Menstruation stops.   You have a fever.   You have decreased urine production.   You havepain or bleeding when urinating. MAKE SURE YOU:  Understand these instructions.  Will watch your condition.  Will get help right away if you are not doing well or get worse. FOR MORE INFORMATION   American Association of Kidney Patients: ResidentialShow.is  National Kidney Foundation: www.kidney.org  American Kidney Fund: FightingMatch.com.ee  [facility]: www.lifeoptions.org and www.kidneyschool.org Document Released: 06/10/2008 Document Revised: 01/16/2014 Document Reviewed: 04/30/2012 [facility] Patient Information 2015 [place], [place]. This information is not intended to replace  advice given to you by your health care provider. Make sure you discuss any questions you have with your health care provider.

## 2014-09-25 NOTE — ED Notes (Signed)
Pt transported to vascular.  °

## 2014-09-25 NOTE — ED Notes (Signed)
Dr. Docherty at the bedside.  

## 2014-09-25 NOTE — Progress Notes (Signed)
Asked to over-read EKG by Dr. Micheline Mazeocherty. 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  Chrystie NoseKenneth C. Croix Presley, MD, [facility] Center[facility]ACC Attending Cardiologist [facility]

## 2014-09-25 NOTE — Progress Notes (Signed)
VASCULAR LAB PRELIMINARY  PRELIMINARY  PRELIMINARY  PRELIMINARY  Bilateral lower extremity venous duplex completed.    Preliminary report:  Bilateral:  No evidence of DVT, superficial thrombosis, or Baker's Cyst.   Asim Gersten, RVS 09/25/2014, 8:46 AM

## 2014-09-25 NOTE — ED Notes (Signed)
Patient reports when he walks both his legs hurt, and has a non-productive cough.  Reports pain in his ears when he coughs.  Reports pain 8/10.

## 2014-10-18 ENCOUNTER — Encounter: Payer: Self-pay | Admitting: Interventional Cardiology

## 2014-12-11 ENCOUNTER — Encounter: Payer: Self-pay | Admitting: Interventional Cardiology

## 2015-01-04 ENCOUNTER — Encounter: Payer: Self-pay | Admitting: Interventional Cardiology

## 2015-09-24 ENCOUNTER — Encounter (HOSPITAL_COMMUNITY): Payer: Self-pay | Admitting: *Deleted

## 2015-09-24 ENCOUNTER — Emergency Department (HOSPITAL_COMMUNITY)
Admission: EM | Admit: 2015-09-24 | Discharge: 2015-09-24 | Disposition: A | Discharge status: Home / Self Care | Payer: Medicare Other | Attending: Emergency Medicine | Admitting: Emergency Medicine

## 2015-09-24 ENCOUNTER — Emergency Department (HOSPITAL_COMMUNITY): Payer: Medicare Other

## 2015-09-24 DIAGNOSIS — S52022A Displaced fracture of olecranon process without intraarticular extension of left ulna, initial encounter for closed fracture: Secondary | ICD-10-CM | POA: Diagnosis not present

## 2015-09-24 DIAGNOSIS — Z7901 Long term (current) use of anticoagulants: Secondary | ICD-10-CM | POA: Diagnosis not present

## 2015-09-24 DIAGNOSIS — S52032A Displaced fracture of olecranon process with intraarticular extension of left ulna, initial encounter for closed fracture: Secondary | ICD-10-CM | POA: Diagnosis not present

## 2015-09-24 DIAGNOSIS — Y9289 Other specified places as the place of occurrence of the external cause: Secondary | ICD-10-CM | POA: Diagnosis not present

## 2015-09-24 DIAGNOSIS — F17210 Nicotine dependence, cigarettes, uncomplicated: Secondary | ICD-10-CM | POA: Diagnosis not present

## 2015-09-24 DIAGNOSIS — Z8701 Personal history of pneumonia (recurrent): Secondary | ICD-10-CM | POA: Insufficient documentation

## 2015-09-24 DIAGNOSIS — Y9389 Activity, other specified: Secondary | ICD-10-CM | POA: Diagnosis not present

## 2015-09-24 DIAGNOSIS — W000XXA Fall on same level due to ice and snow, initial encounter: Secondary | ICD-10-CM | POA: Insufficient documentation

## 2015-09-24 DIAGNOSIS — T148XXA Other injury of unspecified body region, initial encounter: Secondary | ICD-10-CM

## 2015-09-24 DIAGNOSIS — M7989 Other specified soft tissue disorders: Secondary | ICD-10-CM | POA: Diagnosis not present

## 2015-09-24 DIAGNOSIS — I10 Essential (primary) hypertension: Secondary | ICD-10-CM | POA: Insufficient documentation

## 2015-09-24 DIAGNOSIS — Y998 Other external cause status: Secondary | ICD-10-CM | POA: Insufficient documentation

## 2015-09-24 DIAGNOSIS — Z79899 Other long term (current) drug therapy: Secondary | ICD-10-CM | POA: Insufficient documentation

## 2015-09-24 DIAGNOSIS — Z86718 Personal history of other venous thrombosis and embolism: Secondary | ICD-10-CM | POA: Insufficient documentation

## 2015-09-24 DIAGNOSIS — S4992XA Unspecified injury of left shoulder and upper arm, initial encounter: Secondary | ICD-10-CM | POA: Diagnosis not present

## 2015-09-24 MED ORDER — OXYCODONE-ACETAMINOPHEN 5-325 MG PO TABS: 1.0000 | ORAL_TABLET | Freq: Four times a day (QID) | ORAL | Status: DC | PRN

## 2015-09-24 MED ORDER — METHOCARBAMOL 500 MG PO TABS
1000.0000 mg | ORAL_TABLET | Freq: Once | ORAL | Status: AC
  Administered 2015-09-24: 1000 mg via ORAL
  Filled 2015-09-24: qty 2

## 2015-09-24 MED ORDER — IBUPROFEN 400 MG PO TABS
800.0000 mg | ORAL_TABLET | Freq: Once | ORAL | Status: AC
  Administered 2015-09-24: 800 mg via ORAL
  Filled 2015-09-24: qty 2

## 2015-09-24 NOTE — ED Notes (Signed)
Patient assisted with putting on paper scrub top

## 2015-09-24 NOTE — ED Notes (Signed)
PT placed in gown

## 2015-09-24 NOTE — Progress Notes (Signed)
Orthopedic Tech Progress Note Patient Details:  Jennette BankerHarry Colligan 18-Jun-1956 [id number]  Ortho Devices Type of Ortho Device: Ace wrap, Arm sling, Long arm splint Ortho Device/Splint Interventions: Application   Saul FordyceJennifer C Dennis Killilea 09/24/2015, 1:37 PM

## 2015-09-24 NOTE — ED Notes (Signed)
Declined W/C at D/C and was escorted to lobby by RN. 

## 2015-09-24 NOTE — ED Notes (Signed)
Pt reports he fell on ice on Sunday. Pt now has Lt shoulder pain and Lt elbow . Pt moves all fingers but is unable to lift Lt arm.

## 2015-09-24 NOTE — ED Notes (Signed)
See PA assessmnt

## 2015-09-24 NOTE — ED Provider Notes (Signed)
CSN: [id number]     Arrival date & time 09/24/15  1052 History  By signing my name below, I, Phillis Haggis, attest that this documentation has been prepared under the direction and in the presence of Lane Hacker, PA-C. Electronically Signed: Phillis Haggis, ED Scribe. 09/24/2015. 12:14 PM.   Chief Complaint  Patient presents with  . Arm Pain   Patient is a 60 y.o. male presenting with fall. The history is provided by the patient. No language interpreter was used.  Fall This is a new problem. The current episode started yesterday. The problem occurs constantly. The problem has been gradually worsening. Pertinent negatives include no chest pain, no abdominal pain, no headaches and no shortness of breath. He has tried nothing for the symptoms.  HPI Comments: Malik Mullins is a 60 y.o. Male with a hx of HTN, DVT, and knee joint pain who presents to the Emergency Department complaining of a fall onset one day ago. Pt states that he slipped and fell on the ice while getting groceries out of his car. He reports falling on his buttocks and left arm. He is now complaining of left arm pain with associated swelling. He has not tried anything for his symptoms. He denies hitting head, LOC, numbness, weakness, abdominal pain, headache, chest pain or SOB. Pt is right hand dominant.  Past Medical History  Diagnosis Date  . Hypertension   . Back pain   . 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 Knee joint pain   . Pneumonia    Past Surgical History  Procedure Laterality Date  . Mandible surgery     Family History  Problem Relation Age of Onset  . Hypertension Mother   . Diabetes Father    Social History  Substance Use Topics  . Smoking status: Current Some Day Smoker -- 0.30 packs/day for 40 years    Types: Cigarettes  . Smokeless tobacco: Never Used  . Alcohol Use: Yes     Comment: "from time to time"    Review of Systems  Respiratory: Negative for shortness of breath.    Cardiovascular: Negative for chest pain.  Gastrointestinal: Negative for abdominal pain.  Musculoskeletal: Positive for arthralgias.  Neurological: Negative for syncope, weakness, numbness and headaches.  All other systems reviewed and are negative.     Allergies  Lisinopril and Tramadol  Home Medications   Prior to Admission medications   Medication Sig Start Date End Date Taking? Authorizing Provider  albuterol (PROVENTIL HFA;VENTOLIN HFA) 108 (90 BASE) MCG/ACT inhaler Inhale 2 puffs into the lungs every 6 (six) hours as needed for wheezing or shortness of breath.     Historical Provider, MD  HYDROcodone-acetaminophen (NORCO) 5-325 MG per tablet Take 1 tablet by mouth every 6 (six) hours as needed. 04/06/13   Tatyana Kirichenko, PA-C  Multiple Vitamin (MULTIVITAMIN WITH MINERALS) TABS Take 1 tablet by mouth every morning.     Historical Provider, MD  Rivaroxaban (XARELTO) 20 MG TABS Take 20 mg by mouth every morning.     Historical Provider, MD   BP 150/84 mmHg  Pulse 91  Temp(Src) 98.1 F (36.7 C) (Oral)  Resp 16  Ht 6\' 1"  (1.854 m)  Wt 238 lb (107.956 kg)  BMI 31.41 kg/m2  SpO2 100% Physical Exam  Constitutional: He appears well-developed and well-nourished. No distress.  HENT:  Head: Normocephalic and atraumatic.  Mouth/Throat: Oropharynx is clear and moist. No oropharyngeal exudate.  Eyes: Conjunctivae are normal. Pupils  are equal, round, and reactive to light. Right eye exhibits no discharge. Left eye exhibits no discharge. No scleral icterus.  Neck: Normal range of motion. No tracheal deviation present.  Cardiovascular: Normal rate, regular rhythm, normal heart sounds and intact distal pulses.  Exam reveals no gallop and no friction rub.   No murmur heard. Pulmonary/Chest: Effort normal and breath sounds normal. No respiratory distress. He has no wheezes. He has no rales. He exhibits no tenderness.  Abdominal: Soft. Bowel sounds are normal. He exhibits no distension  and no mass. There is no tenderness. There is no rebound and no guarding.  Musculoskeletal: He exhibits tenderness. He exhibits no edema.  ROM limited due to pain along entire left arm. Neurovascularly intact with decreased sensation along left hand diffusely.   Lymphadenopathy:    He has no cervical adenopathy.  Neurological: He is alert. Coordination normal.  Skin: Skin is warm and dry. No rash noted. He is not diaphoretic. No erythema.  Psychiatric: He has a normal mood and affect. His behavior is normal.  Nursing note and vitals reviewed.   ED Course  Procedures  DIAGNOSTIC STUDIES: Oxygen Saturation is 100% on RA, normal by my interpretation.    COORDINATION OF CARE: 11:25 AM-Discussed treatment plan which includes x-ray with pt at bedside and pt agreed to plan.   Imaging Review Dg Clavicle Left  09/24/2015  CLINICAL DATA:  Status post fall on ice yesterday with injury of the left shoulder EXAM: LEFT CLAVICLE - 2+ VIEWS COMPARISON:  Left shoulder series of today's date and left shoulder series of April 06, 2013. FINDINGS: The left clavicle is intact. There are degenerative changes of the [facility] joint with articular margins spurring and narrowing of the joint space. The observed portions of the upper left ribs are normal. IMPRESSION: There are degenerative changes of the [facility] joint. There is no acute clavicular fracture or dislocation. Electronically Signed   By: David  SwazilandJordan M.D.   On: 09/24/2015 12:03   Dg Elbow Complete Left  09/24/2015  CLINICAL DATA:  Status post fall yesterday injuring the left elbow ; pain and swelling of the elbow is present EXAM: LEFT ELBOW - COMPLETE 3+ VIEW COMPARISON:  Left elbow series of April 06, 2013 FINDINGS: The bones of the elbow are adequately mineralized. There is an acute obliquely oriented fracture through the proximal ulna extending to to the articular surface. There is distraction of approximately 3 mm at the fracture site with mild angulation. The  condylar and supracondylar regions of the distal humerus are intact. IMPRESSION: The patient has sustained an obliquely oriented fracture through the olecranon with the fracture extending from the articular fossa of the ulna obliquely posterior to the posterior cortical surface of the olecranon. Electronically Signed   By: David  [place]Jordan M.D.   On: 09/24/2015 12:06   Dg Shoulder Left  09/24/2015  CLINICAL DATA:  Fall.  Initial evaluation. EXAM: LEFT SHOULDER - 2+ VIEW COMPARISON:  04/06/2013. FINDINGS: Acromioclavicular and glenohumeral prominent degenerative change. No acute bony or joint abnormality. No evidence fracture dislocation. IMPRESSION: No acute abnormality. Prominent acromioclavicular glenohumeral degenerative change degenerative change. Electronically Signed   By: Maisie Fushomas  Register   On: 09/24/2015 12:10   Dg Hand Complete Left  09/24/2015  CLINICAL DATA:  Fall on ice yesterday.  Left elbow and hand pain. EXAM: LEFT HAND - COMPLETE 3+ VIEW COMPARISON:  Left wrist radiographs 03/03/2013. FINDINGS: Moderate degenerative changes at the DRUJ there are again noted. Soft tissue swelling is present over  the distal ulna. There is no underlying fracture. No radiopaque foreign body is present. There is mild soft tissue swelling over the dorsum of the hand without an underlying fracture. The joints are located. Bone mineralization is within normal limits. IMPRESSION: 1. Soft tissue swelling of the distal ulna and dorsal MCP joints without acute underlying fracture. 2. Chronic degenerative changes at the DRUJ. Electronically Signed   By: Marin Roberts M.D.   On: 09/24/2015 12:08   I have personally reviewed and evaluated these images and lab results as part of my medical decision-making.   MDM   Final diagnoses:  Fracture   Left elbow xray demonstrates obliquely oriented fracture through the proximal ulna extending to to the articular surface. There is distraction of approximately 3 mm at the  fracture site with mild angulation. The condylar and supracondylar regions of the distal humerus are intact. Discussed case with Dr. Lajoyce Corners who advises OP followup.  Patient may be safely discharged home. Discussed reasons for return. Patient to follow-up with orthopedics. Patient in understanding and agreement with the plan. I personally performed the services described in this documentation, which was scribed in my presence. The recorded information has been reviewed and is accurate.    Melton Krebs, PA-C 09/26/15 1610  Margarita Grizzle, MD 10/02/15 1455

## 2015-09-24 NOTE — Discharge Instructions (Signed)
Mr. Malik Mullins,  Nice meeting you! Please follow-up with Dr. Lajoyce Cornersuda, Orthopedic Surgeon. Return to the emergency department if you develop increasing pain, numbness/tingling in hand, color changes in hand. Feel better soon!  S. Lane HackerNicole Avaiyah Strubel, PA-C

## 2015-09-25 ENCOUNTER — Other Ambulatory Visit (HOSPITAL_BASED_OUTPATIENT_CLINIC_OR_DEPARTMENT_OTHER): Payer: Self-pay | Admitting: Orthopaedic Surgery

## 2015-09-25 DIAGNOSIS — S52022A Displaced fracture of olecranon process without intraarticular extension of left ulna, initial encounter for closed fracture: Secondary | ICD-10-CM | POA: Diagnosis not present

## 2015-09-25 DIAGNOSIS — S52022D Displaced fracture of olecranon process without intraarticular extension of left ulna, subsequent encounter for closed fracture with routine healing: Secondary | ICD-10-CM | POA: Diagnosis not present

## 2015-09-26 ENCOUNTER — Ambulatory Visit (HOSPITAL_BASED_OUTPATIENT_CLINIC_OR_DEPARTMENT_OTHER)
Admission: RE | Admit: 2015-09-26 | Discharge: 2015-09-26 | Disposition: A | Discharge status: Home / Self Care | Payer: Medicare Other | Source: Ambulatory Visit | Attending: Orthopaedic Surgery | Admitting: Orthopaedic Surgery

## 2015-09-26 ENCOUNTER — Encounter (HOSPITAL_BASED_OUTPATIENT_CLINIC_OR_DEPARTMENT_OTHER)
Admission: RE | Disposition: A | Discharge status: Home / Self Care | Payer: Self-pay | Source: Ambulatory Visit | Attending: Orthopaedic Surgery

## 2015-09-26 ENCOUNTER — Ambulatory Visit (HOSPITAL_BASED_OUTPATIENT_CLINIC_OR_DEPARTMENT_OTHER): Payer: Medicare Other | Admitting: Certified Registered"

## 2015-09-26 ENCOUNTER — Encounter (HOSPITAL_BASED_OUTPATIENT_CLINIC_OR_DEPARTMENT_OTHER): Payer: Self-pay | Admitting: Certified Registered"

## 2015-09-26 DIAGNOSIS — I10 Essential (primary) hypertension: Secondary | ICD-10-CM | POA: Diagnosis not present

## 2015-09-26 DIAGNOSIS — M1990 Unspecified osteoarthritis, unspecified site: Secondary | ICD-10-CM | POA: Insufficient documentation

## 2015-09-26 DIAGNOSIS — I739 Peripheral vascular disease, unspecified: Secondary | ICD-10-CM | POA: Insufficient documentation

## 2015-09-26 DIAGNOSIS — G8918 Other acute postprocedural pain: Secondary | ICD-10-CM | POA: Diagnosis not present

## 2015-09-26 DIAGNOSIS — Z7901 Long term (current) use of anticoagulants: Secondary | ICD-10-CM | POA: Diagnosis not present

## 2015-09-26 DIAGNOSIS — S52022A Displaced fracture of olecranon process without intraarticular extension of left ulna, initial encounter for closed fracture: Secondary | ICD-10-CM | POA: Insufficient documentation

## 2015-09-26 DIAGNOSIS — F17210 Nicotine dependence, cigarettes, uncomplicated: Secondary | ICD-10-CM | POA: Insufficient documentation

## 2015-09-26 DIAGNOSIS — X58XXXA Exposure to other specified factors, initial encounter: Secondary | ICD-10-CM | POA: Diagnosis not present

## 2015-09-26 DIAGNOSIS — M25522 Pain in left elbow: Secondary | ICD-10-CM | POA: Diagnosis not present

## 2015-09-26 DIAGNOSIS — Z86718 Personal history of other venous thrombosis and embolism: Secondary | ICD-10-CM | POA: Diagnosis not present

## 2015-09-26 HISTORY — PX: ORIF ELBOW FRACTURE: SHX5031

## 2015-09-26 LAB — POCT I-STAT, CHEM 8
BUN: 20 mg/dL (ref 6–20)
CALCIUM ION: 1.14 mmol/L (ref 1.13–1.30)
CHLORIDE: 107 mmol/L (ref 101–111)
Creatinine, Ser: 1.6 mg/dL — ABNORMAL HIGH (ref 0.61–1.24)
GLUCOSE: 97 mg/dL (ref 65–99)
HCT: 49 % (ref 39.0–52.0)
Hemoglobin: 16.7 g/dL (ref 13.0–17.0)
POTASSIUM: 3.9 mmol/L (ref 3.5–5.1)
Sodium: 142 mmol/L (ref 135–145)
TCO2: 26 mmol/L (ref 0–100)

## 2015-09-26 SURGERY — OPEN REDUCTION INTERNAL FIXATION (ORIF) ELBOW/OLECRANON FRACTURE
Anesthesia: Regional | Site: Elbow | Laterality: Left

## 2015-09-26 MED ORDER — OXYCODONE HCL ER 10 MG PO T12A: 10.0000 mg | EXTENDED_RELEASE_TABLET | Freq: Two times a day (BID) | ORAL | Status: DC

## 2015-09-26 MED ORDER — ONDANSETRON HCL 4 MG/2ML IJ SOLN
INTRAMUSCULAR | Status: DC | PRN
  Administered 2015-09-26: 4 mg via INTRAVENOUS

## 2015-09-26 MED ORDER — MIDAZOLAM HCL 2 MG/2ML IJ SOLN
1.0000 mg | INTRAMUSCULAR | Status: DC | PRN
  Administered 2015-09-26: 2 mg via INTRAVENOUS

## 2015-09-26 MED ORDER — MIDAZOLAM HCL 2 MG/2ML IJ SOLN
INTRAMUSCULAR | Status: AC
  Filled 2015-09-26: qty 2

## 2015-09-26 MED ORDER — LIDOCAINE HCL (CARDIAC) 20 MG/ML IV SOLN
INTRAVENOUS | Status: AC
  Filled 2015-09-26: qty 5

## 2015-09-26 MED ORDER — FENTANYL CITRATE (PF) 100 MCG/2ML IJ SOLN
INTRAMUSCULAR | Status: AC
  Filled 2015-09-26: qty 2

## 2015-09-26 MED ORDER — LIDOCAINE HCL (CARDIAC) 20 MG/ML IV SOLN
INTRAVENOUS | Status: DC | PRN
  Administered 2015-09-26: 60 mg via INTRAVENOUS

## 2015-09-26 MED ORDER — SENNOSIDES-DOCUSATE SODIUM 8.6-50 MG PO TABS: 1.0000 | ORAL_TABLET | Freq: Every evening | ORAL | Status: DC | PRN

## 2015-09-26 MED ORDER — FENTANYL CITRATE (PF) 100 MCG/2ML IJ SOLN
25.0000 ug | INTRAMUSCULAR | Status: DC | PRN
  Administered 2015-09-26: 50 ug via INTRAVENOUS
  Administered 2015-09-26: 25 ug via INTRAVENOUS

## 2015-09-26 MED ORDER — SUCCINYLCHOLINE CHLORIDE 20 MG/ML IJ SOLN
INTRAMUSCULAR | Status: DC | PRN
  Administered 2015-09-26: 100 mg via INTRAVENOUS

## 2015-09-26 MED ORDER — CEFAZOLIN SODIUM-DEXTROSE 2-3 GM-% IV SOLR
2.0000 g | INTRAVENOUS | Status: AC
  Administered 2015-09-26: 2 g via INTRAVENOUS

## 2015-09-26 MED ORDER — LIDOCAINE HCL 4 % EX SOLN
CUTANEOUS | Status: DC | PRN
  Administered 2015-09-26: 2 mL via TOPICAL

## 2015-09-26 MED ORDER — BUPIVACAINE-EPINEPHRINE (PF) 0.5% -1:200000 IJ SOLN
INTRAMUSCULAR | Status: DC | PRN
  Administered 2015-09-26: 30 mL

## 2015-09-26 MED ORDER — LACTATED RINGERS IV SOLN
INTRAVENOUS | Status: DC
  Administered 2015-09-26 (×2): via INTRAVENOUS

## 2015-09-26 MED ORDER — ONDANSETRON HCL 4 MG PO TABS: 4.0000 mg | ORAL_TABLET | Freq: Three times a day (TID) | ORAL | Status: DC | PRN

## 2015-09-26 MED ORDER — EPHEDRINE SULFATE 50 MG/ML IJ SOLN
INTRAMUSCULAR | Status: DC | PRN
  Administered 2015-09-26: 5 mg via INTRAVENOUS
  Administered 2015-09-26: 10 mg via INTRAVENOUS

## 2015-09-26 MED ORDER — SCOPOLAMINE 1 MG/3DAYS TD PT72: 1.0000 | MEDICATED_PATCH | Freq: Once | TRANSDERMAL | Status: DC | PRN

## 2015-09-26 MED ORDER — ONDANSETRON HCL 4 MG/2ML IJ SOLN
4.0000 mg | Freq: Once | INTRAMUSCULAR | Status: DC | PRN
Start: 2015-09-26 — End: 2015-09-26

## 2015-09-26 MED ORDER — GLYCOPYRROLATE 0.2 MG/ML IJ SOLN: 0.2000 mg | Freq: Once | INTRAMUSCULAR | Status: DC | PRN

## 2015-09-26 MED ORDER — PROPOFOL 10 MG/ML IV BOLUS
INTRAVENOUS | Status: AC
  Filled 2015-09-26: qty 20

## 2015-09-26 MED ORDER — PROPOFOL 10 MG/ML IV BOLUS
INTRAVENOUS | Status: DC | PRN
  Administered 2015-09-26: 250 mg via INTRAVENOUS
  Administered 2015-09-26: 30 mg via INTRAVENOUS

## 2015-09-26 MED ORDER — DEXAMETHASONE SODIUM PHOSPHATE 10 MG/ML IJ SOLN
INTRAMUSCULAR | Status: DC | PRN
  Administered 2015-09-26: 10 mg via INTRAVENOUS

## 2015-09-26 MED ORDER — OXYCODONE-ACETAMINOPHEN 5-325 MG PO TABS: 1.0000 | ORAL_TABLET | ORAL | Status: DC | PRN

## 2015-09-26 MED ORDER — METHOCARBAMOL 750 MG PO TABS: 750.0000 mg | ORAL_TABLET | Freq: Two times a day (BID) | ORAL | Status: DC | PRN

## 2015-09-26 MED ORDER — CEFAZOLIN SODIUM-DEXTROSE 2-3 GM-% IV SOLR
INTRAVENOUS | Status: AC
  Filled 2015-09-26: qty 50

## 2015-09-26 MED ORDER — FENTANYL CITRATE (PF) 100 MCG/2ML IJ SOLN
50.0000 ug | INTRAMUSCULAR | Status: AC | PRN
  Administered 2015-09-26: 100 ug via INTRAVENOUS
  Administered 2015-09-26 (×2): 50 ug via INTRAVENOUS

## 2015-09-26 SURGICAL SUPPLY — 79 items
1.6 KWIRE ×3 IMPLANT
BANDAGE ACE 4X5 VEL STRL LF (GAUZE/BANDAGES/DRESSINGS) ×6 IMPLANT
BIT DRILL 2.0 QC 6.3IN (BIT) ×3 IMPLANT
BIT DRILL QC 2.7 6.3IN  SHORT (BIT) ×2
BIT DRILL QC 2.7 6.3IN SHORT (BIT) ×1 IMPLANT
BLADE HEX COATED 2.75 (ELECTRODE) ×3 IMPLANT
BLADE SURG 15 STRL LF DISP TIS (BLADE) ×2 IMPLANT
BLADE SURG 15 STRL SS (BLADE) ×4
BNDG COHESIVE 4X5 TAN STRL (GAUZE/BANDAGES/DRESSINGS) ×3 IMPLANT
BNDG ESMARK 4X9 LF (GAUZE/BANDAGES/DRESSINGS) ×3 IMPLANT
CLOSURE WOUND 1/4X4 (GAUZE/BANDAGES/DRESSINGS)
CORDS BIPOLAR (ELECTRODE) ×3 IMPLANT
COVER BACK TABLE 60X90IN (DRAPES) ×3 IMPLANT
COVER BACK TABLE 80X110 HD (DRAPES) ×3 IMPLANT
CUFF TOURN SGL LL 18 NRW (TOURNIQUET CUFF) ×3 IMPLANT
DECANTER SPIKE VIAL GLASS SM (MISCELLANEOUS) IMPLANT
DRAPE C-ARM 42X72 X-RAY (DRAPES) IMPLANT
DRAPE EXTREMITY T 121X128X90 (DRAPE) ×3 IMPLANT
DRAPE INCISE IOBAN 66X45 STRL (DRAPES) ×3 IMPLANT
DRAPE OEC MINIVIEW 54X84 (DRAPES) ×3 IMPLANT
DRAPE SURG 17X23 STRL (DRAPES) ×3 IMPLANT
DRAPE U 20/CS (DRAPES) ×6 IMPLANT
GAUZE SPONGE 4X4 12PLY STRL (GAUZE/BANDAGES/DRESSINGS) ×3 IMPLANT
GAUZE XEROFORM 1X8 LF (GAUZE/BANDAGES/DRESSINGS) ×3 IMPLANT
GLOVE BIOGEL M STRL SZ7.5 (GLOVE) ×6 IMPLANT
GLOVE BIOGEL PI IND STRL 8 (GLOVE) ×1 IMPLANT
GLOVE BIOGEL PI INDICATOR 8 (GLOVE) ×2
GLOVE NEODERM STRL 7.5 LF PF (GLOVE) IMPLANT
GLOVE SURG NEODERM 7.5  LF PF (GLOVE)
GLOVE SURG SS PI 7.5 STRL IVOR (GLOVE) ×3 IMPLANT
GLOVE SURG SYN 7.5  E (GLOVE) ×2
GLOVE SURG SYN 7.5 E (GLOVE) ×1 IMPLANT
GOWN STRL REIN XL XLG (GOWN DISPOSABLE) ×3 IMPLANT
GOWN STRL REUS W/ TWL LRG LVL3 (GOWN DISPOSABLE) IMPLANT
GOWN STRL REUS W/ TWL XL LVL3 (GOWN DISPOSABLE) ×1 IMPLANT
GOWN STRL REUS W/TWL LRG LVL3 (GOWN DISPOSABLE)
GOWN STRL REUS W/TWL XL LVL3 (GOWN DISPOSABLE) ×2
LIQUID BAND (GAUZE/BANDAGES/DRESSINGS) ×3 IMPLANT
LOOP VESSEL MAXI BLUE (MISCELLANEOUS) IMPLANT
NEEDLE HYPO 25X1 1.5 SAFETY (NEEDLE) IMPLANT
NS IRRIG 1000ML POUR BTL (IV SOLUTION) ×3 IMPLANT
PACK BASIN DAY SURGERY FS (CUSTOM PROCEDURE TRAY) ×3 IMPLANT
PAD CAST 4YDX4 CTTN HI CHSV (CAST SUPPLIES) ×1 IMPLANT
PADDING CAST ABS 4INX4YD NS (CAST SUPPLIES)
PADDING CAST ABS COTTON 4X4 ST (CAST SUPPLIES) IMPLANT
PADDING CAST COTTON 4X4 STRL (CAST SUPPLIES) ×2
PENCIL BUTTON HOLSTER BLD 10FT (ELECTRODE) ×3 IMPLANT
PLATE 8 HOLE OLECRANON (Plate) ×3 IMPLANT
SCREW CORT T20 20X3.5XST NS LF (Screw) ×2 IMPLANT
SCREW CORTEX 3.5X24 (Screw) ×3 IMPLANT
SCREW CORTEX 3.5X32 (Screw) ×3 IMPLANT
SCREW CORTICAL 3.5X20 (Screw) ×4 IMPLANT
SCREW LOCK 2.7X60 (Screw) ×6 IMPLANT
SCREW LOCK 3.5X18 (Screw) ×6 IMPLANT
SCREW LOCK 3.5X24MM (Screw) ×3 IMPLANT
SLEEVE SCD COMPRESS KNEE MED (MISCELLANEOUS) ×3 IMPLANT
SLING ARM FOAM STRAP LRG (SOFTGOODS) ×3 IMPLANT
SPLINT FIBERGLASS 3X35 (CAST SUPPLIES) ×3 IMPLANT
SPLINT FIBERGLASS 4X30 (CAST SUPPLIES) ×3 IMPLANT
SPONGE LAP 18X18 X RAY DECT (DISPOSABLE) ×3 IMPLANT
STOCKINETTE IMPERVIOUS LG (DRAPES) ×3 IMPLANT
STRIP CLOSURE SKIN 1/4X4 (GAUZE/BANDAGES/DRESSINGS) IMPLANT
SUCTION FRAZIER HANDLE 10FR (MISCELLANEOUS) ×2
SUCTION TUBE FRAZIER 10FR DISP (MISCELLANEOUS) ×1 IMPLANT
SUT FIBERWIRE #2 38 T-5 BLUE (SUTURE)
SUT MNCRL AB 4-0 PS2 18 (SUTURE) ×3 IMPLANT
SUT VIC AB 0 CT1 18XCR BRD 8 (SUTURE) IMPLANT
SUT VIC AB 0 CT1 8-18 (SUTURE)
SUT VIC AB 2-0 CT1 27 (SUTURE) ×2
SUT VIC AB 2-0 CT1 TAPERPNT 27 (SUTURE) ×1 IMPLANT
SUTURE FIBERWR #2 38 T-5 BLUE (SUTURE) IMPLANT
SYR BULB 3OZ (MISCELLANEOUS) ×3 IMPLANT
SYR CONTROL 10ML LL (SYRINGE) IMPLANT
TOWEL OR 17X24 6PK STRL BLUE (TOWEL DISPOSABLE) ×6 IMPLANT
TOWEL OR NON WOVEN STRL DISP B (DISPOSABLE) ×3 IMPLANT
TUBE CONNECTING 20'X1/4 (TUBING) ×1
TUBE CONNECTING 20X1/4 (TUBING) ×2 IMPLANT
UNDERPAD 30X30 (UNDERPADS AND DIAPERS) ×3 IMPLANT
YANKAUER SUCT BULB TIP NO VENT (SUCTIONS) ×3 IMPLANT

## 2015-09-26 NOTE — Discharge Instructions (Signed)
Postoperative instructions: ° °Weightbearing: non weight bearing ° °Dressing instructions: Keep your dressing and/or splint clean and dry at all times.  It will be removed at your first post-operative appointment.  Your stitches and/or staples will be removed at this visit. ° °Incision instructions:  Do not soak your incision for 3 weeks after surgery.  If the incision gets wet, pat dry and do not scrub the incision. ° °Pain control:  You have been given a prescription to be taken as directed for post-operative pain control.  In addition, elevate the operative extremity above the heart at all times to prevent swelling and throbbing pain. ° °Take over-the-counter Colace, 100mg by mouth twice a day while taking narcotic pain medications to help prevent constipation. ° °Follow up appointments: °1) 10-14 days for suture removal and wound check. °2) Dr. Xu as scheduled. ° ° ------------------------------------------------------------------------------------------------------------- ° °After Surgery Pain Control: ° °After your surgery, post-surgical discomfort or pain is likely. This discomfort can last several days to a few weeks. At certain times of the day your discomfort may be more intense.  °Did you receive a nerve block?  °A nerve block can provide pain relief for one hour to two days after your surgery. As long as the nerve block is working, you will experience little or no sensation in the area the surgeon operated on.  °As the nerve block wears off, you will begin to experience pain or discomfort. It is very important that you begin taking your prescribed pain medication before the nerve block fully wears off. Treating your pain at the first sign of the block wearing off will ensure your pain is better controlled and more tolerable when full-sensation returns. Do not wait until the pain is intolerable, as the medicine will be less effective. It is better to treat pain in advance than to try and catch up.    °General Anesthesia:  °If you did not receive a nerve block during your surgery, you will need to start taking your pain medication shortly after your surgery and should continue to do so as prescribed by your surgeon.  °Pain Medication:  °Most commonly we prescribe Vicodin and Percocet for post-operative pain. Both of these medications contain a combination of acetaminophen (Tylenol®) and a narcotic to help control pain.  °· It takes between 30 and 45 minutes before pain medication starts to work. It is important to take your medication before your pain level gets too intense.  °· Nausea is a common side effect of many pain medications. You will want to eat something before taking your pain medicine to help prevent nausea.  °· If you are taking a prescription pain medication that contains acetaminophen, we recommend that you do not take additional over the counter acetaminophen (Tylenol®).  °Other pain relieving options:  °· Using a cold pack to ice the affected area a few times a day (15 to 20 minutes at a time) can help to relieve pain, reduce swelling and bruising.  °· Elevation of the affected area can also help to reduce pain and swelling. ° ° ° ° °Post Anesthesia Home Care Instructions ° °Activity: °Get plenty of rest for the remainder of the day. A responsible adult should stay with you for 24 hours following the procedure.  °For the next 24 hours, DO NOT: °-Drive a car °-Operate machinery °-Drink alcoholic beverages °-Take any medication unless instructed by your physician °-Make any legal decisions or sign important papers. ° °Meals: °Start with liquid foods such as gelatin   or soup. Progress to regular foods as tolerated. Avoid greasy, spicy, heavy foods. If nausea and/or vomiting occur, drink only clear liquids until the nausea and/or vomiting subsides. Call your physician if vomiting continues. ° °Special Instructions/Symptoms: °Your throat may feel dry or sore from the anesthesia or the breathing tube  placed in your throat during surgery. If this causes discomfort, gargle with warm salt water. The discomfort should disappear within 24 hours. ° °If you had a scopolamine patch placed behind your ear for the management of post- operative nausea and/or vomiting: ° °1. The medication in the patch is effective for 72 hours, after which it should be removed.  Wrap patch in a tissue and discard in the trash. Wash hands thoroughly with soap and water. °2. You may remove the patch earlier than 72 hours if you experience unpleasant side effects which may include dry mouth, dizziness or visual disturbances. °3. Avoid touching the patch. Wash your hands with soap and water after contact with the patch. °  °Regional Anesthesia Blocks ° °1. Numbness or the inability to move the "blocked" extremity may last from 3-48 hours after placement. The length of time depends on the medication injected and your individual response to the medication. If the numbness is not going away after 48 hours, call your surgeon. ° °2. The extremity that is blocked will need to be protected until the numbness is gone and the  Strength has returned. Because you cannot feel it, you will need to take extra care to avoid injury. Because it may be weak, you may have difficulty moving it or using it. You may not know what position it is in without looking at it while the block is in effect. ° °3. For blocks in the legs and feet, returning to weight bearing and walking needs to be done carefully. You will need to wait until the numbness is entirely gone and the strength has returned. You should be able to move your leg and foot normally before you try and bear weight or walk. You will need someone to be with you when you first try to ensure you do not fall and possibly risk injury. ° °4. Bruising and tenderness at the needle site are common side effects and will resolve in a few days. ° °5. Persistent numbness or new problems with movement should be  communicated to the surgeon or the  Surgery Center ([phone])/ [facility] ([phone]). ° ° °

## 2015-09-26 NOTE — Transfer of Care (Signed)
Immediate Anesthesia Transfer of Care Note  Patient: Malik GeeHarry E Vasbinder  Procedure(s) Performed: Procedure(s): OPEN REDUCTION INTERNAL FIXATION (ORIF) LEFT OLECRANON FRACTURE (Left)  Patient Location: PACU  Anesthesia Type:GA combined with regional for post-op pain  Level of Consciousness: awake, alert , oriented and patient cooperative  Airway & Oxygen Therapy: Patient Spontanous Breathing and Patient connected to face mask oxygen  Post-op Assessment: Report given to RN, Post -op Vital signs reviewed and stable and Patient moving all extremities  Post vital signs: Reviewed and stable  Last Vitals:  Filed Vitals:   09/26/15 0855 09/26/15 0900  BP:  116/69  Pulse: 66 64  Temp:    Resp: 7 8    Complications: No apparent anesthesia complications

## 2015-09-26 NOTE — Progress Notes (Signed)
Assisted Dr. M. Judd with left, ultrasound guided, supraclavicular block. Side rails up, monitors on throughout procedure. See vital signs in flow sheet. Tolerated Procedure well. 

## 2015-09-26 NOTE — Anesthesia Preprocedure Evaluation (Signed)
Anesthesia Evaluation  Patient identified by MRN, date of birth, ID band Patient awake    Reviewed: Allergy & Precautions, NPO status , Patient's Chart, lab work & pertinent test results  History of Anesthesia Complications Negative for: history of anesthetic complications  Airway Mallampati: II  TM Distance: >3 FB Neck ROM: Full    Dental no notable dental hx. (+) Dental Advisory Given   Pulmonary Current Smoker,    Pulmonary exam normal breath sounds clear to auscultation       Cardiovascular hypertension, Pt. on medications + Peripheral Vascular Disease (hx of DVT, off anticoagulation for years per patient)  Normal cardiovascular exam Rhythm:Regular Rate:Normal     Neuro/Psych negative neurological ROS  negative psych ROS   GI/Hepatic negative GI ROS, Neg liver ROS,   Endo/Other  negative endocrine ROS  Renal/GU negative Renal ROS  negative genitourinary   Musculoskeletal  (+) Arthritis ,   Abdominal   Peds negative pediatric ROS (+)  Hematology negative hematology ROS (+)   Anesthesia Other Findings   Reproductive/Obstetrics negative OB ROS                             Anesthesia Physical Anesthesia Plan  ASA: II  Anesthesia Plan: General   Post-op Pain Management: GA combined w/ Regional for post-op pain   Induction: Intravenous  Airway Management Planned: LMA  Additional Equipment:   Intra-op Plan:   Post-operative Plan: Extubation in OR  Informed Consent: I have reviewed the patients History and Physical, chart, labs and discussed the procedure including the risks, benefits and alternatives for the proposed anesthesia with the patient or authorized representative who has indicated his/her understanding and acceptance.   Dental advisory given  Plan Discussed with: CRNA  Anesthesia Plan Comments:         Anesthesia Quick Evaluation

## 2015-09-26 NOTE — H&P (Signed)
    PREOPERATIVE H&P  Chief Complaint: left olecranon fracture  HPI: Malik Mullins is a 60 y.o. male who presents for surgical treatment of left olecranon fracture.  He denies any changes in medical history.  Past Medical History  Diagnosis Date  . Hypertension   . Back pain   . DVT (deep venous thrombosis) (HCC)     left leg and right leg  . Knee joint pain   . Pneumonia    Past Surgical History  Procedure Laterality Date  . Mandible surgery     Social History   Social History  . Marital Status: Married    Spouse Name: N/A  . Number of Children: N/A  . Years of Education: N/A   Social History Main Topics  . Smoking status: Current Some Day Smoker -- 0.30 packs/day for 40 years    Types: Cigarettes  . Smokeless tobacco: Never Used  . Alcohol Use: Yes     Comment: "from time to time"  . Drug Use: No  . Sexual Activity: No   Other Topics Concern  . None   Social History Narrative   Family History  Problem Relation Age of Onset  . Hypertension Mother   . Diabetes Father    Allergies  Allergen Reactions  . Lisinopril Swelling  . Tramadol Nausea And Vomiting   Prior to Admission medications   Medication Sig Start Date End Date Taking? Authorizing Provider  albuterol (PROVENTIL HFA;VENTOLIN HFA) 108 (90 BASE) MCG/ACT inhaler Inhale 2 puffs into the lungs every 6 (six) hours as needed for wheezing or shortness of breath.    Yes Historical Provider, MD  HYDROcodone-acetaminophen (NORCO) 5-325 MG per tablet Take 1 tablet by mouth every 6 (six) hours as needed. 04/06/13  Yes Tatyana Kirichenko, PA-C  Multiple Vitamin (MULTIVITAMIN WITH MINERALS) TABS Take 1 tablet by mouth every morning.    Yes Historical Provider, MD  oxyCODONE-acetaminophen (PERCOCET/ROXICET) 5-325 MG tablet Take 1-2 tablets by mouth every 6 (six) hours as needed for severe pain. 09/24/15  Yes Melton KrebsSamantha Nicole Riley, PA-C  Rivaroxaban (XARELTO) 20 MG TABS Take 20 mg by mouth every morning.      Historical Provider, MD     Positive ROS: All other systems have been reviewed and were otherwise negative with the exception of those mentioned in the HPI and as above.  Physical Exam: General: Alert, no acute distress Cardiovascular: No pedal edema Respiratory: No cyanosis, no use of accessory musculature GI: abdomen soft Skin: No lesions in the area of chief complaint Neurologic: Sensation intact distally Psychiatric: Patient is competent for consent with normal mood and affect Lymphatic: no lymphedema  MUSCULOSKELETAL: exam stable  Assessment: left olecranon fracture  Plan: Plan for Procedure(s): OPEN REDUCTION INTERNAL FIXATION (ORIF) LEFT OLECRANON FRACTURE  The risks benefits and alternatives were discussed with the patient including but not limited to the risks of nonoperative treatment, versus surgical intervention including infection, bleeding, nerve injury,  blood clots, cardiopulmonary complications, morbidity, mortality, among others, and they were willing to proceed.   Cheral AlmasXu, Naiping Michael, MD   09/26/2015 8:12 AM

## 2015-09-26 NOTE — Anesthesia Postprocedure Evaluation (Signed)
Anesthesia Post Note  Patient: Malik Mullins  Procedure(s) Performed: Procedure(s) (LRB): OPEN REDUCTION INTERNAL FIXATION (ORIF) LEFT OLECRANON FRACTURE (Left)  Patient location during evaluation: PACU Anesthesia Type: General Level of consciousness: awake and alert Pain management: pain level controlled Vital Signs Assessment: post-procedure vital signs reviewed and stable Respiratory status: spontaneous breathing, nonlabored ventilation, respiratory function stable and patient connected to nasal cannula oxygen Cardiovascular status: blood pressure returned to baseline and stable Postop Assessment: no signs of nausea or vomiting Anesthetic complications: no    Last Vitals:  Filed Vitals:   09/26/15 1230 09/26/15 1245  BP: 151/89 149/84  Pulse: 67 65  Temp:  36.4 C  Resp: 14 16    Last Pain:  Filed Vitals:   09/26/15 1245  PainSc: 0-No pain                 Lyndel Dancel JENNETTE

## 2015-09-26 NOTE — Op Note (Signed)
   Date of Surgery: 09/26/2015  INDICATIONS: Mr. Malik Mullins is a 60 y.o.-year-old male with a left olecranon fracture;  The patient did consent to the procedure after discussion of the risks and benefits.  PREOPERATIVE DIAGNOSIS: Left olecranon fracture  POSTOPERATIVE DIAGNOSIS: Same.  PROCEDURE: Open reduction internal fixation of left olecranon fracture  SURGEON: N. Glee ArvinMichael Xu, M.D.  ASSIST: none.  ANESTHESIA:  general, regional  IV FLUIDS AND URINE: See anesthesia.  ESTIMATED BLOOD LOSS: minimal mL.  IMPLANTS: [employer] 8 hole olecranon plate  DRAINS: none  COMPLICATIONS: None.  DESCRIPTION OF PROCEDURE: The patient was brought to the operating room and placed supine on the operating table.  The patient had been signed prior to the procedure and this was documented. The patient had the anesthesia placed by the anesthesiologist.  A time-out was performed to confirm that this was the correct patient, site, side and location. The patient did receive antibiotics prior to the incision and was re-dosed during the procedure as needed at indicated intervals.  A tourniquet placed.  The patient had the operative extremity prepped and draped in the standard surgical fashion.    A direct posterior incision over the proximal ulna was created. Full-thickness flaps were created and elevated off of the proximal ulna. The olecranon bursa was excised. The attachment of the triceps was elevated off of the olecranon just enough to accommodate the plate. We then found the appropriate length of the plate using fluoroscopy. The fracture was reduced and clamped with a tenaculum clamp. I then placed a apex screw in the shaft.  I then placed a second nonlocking screw in the shaft distal to the apex screw. These screws were placed eccentrically in order to compress the fracture. I then placed 2 homerun screws using the locking guide through the proximal portion of the plate. Fluoroscopy was used to confirm  its trajectory. The screws were also able to compress the fracture slightly. I then placed 2 locking screws in the proximal portion of the olecranon making sure not to violate the elbow joint. I then placed a final nonlocking screw distally in the shaft. No x-rays were taken. The wound was thoroughly irrigated and closed in layer fashion. Sterile dressings were applied. Elbow was immobilized in a long-arm splint at 90. Patient tolerated the procedure well was extubated and transferred to the PACU in stable condition.  POSTOPERATIVE PLAN: Patient will be nonweightbearing to the left upper extremity. He will follow-up in 2 weeks for suture removal.  [name]. Michael Xu, MD [facility] [phone] 11:21 AM

## 2015-09-26 NOTE — Anesthesia Procedure Notes (Addendum)
Anesthesia Regional Block:  Supraclavicular block  Pre-Anesthetic Checklist: ,, timeout performed, Correct Patient, Correct Site, Correct Laterality, Correct Procedure, Correct Position, site marked, Risks and benefits discussed,  Surgical consent,  Pre-op evaluation,  At surgeon's request and post-op pain management  Laterality: Left  Prep: chloraprep       Needles:  Injection technique: Single-shot  Needle Type: Echogenic Stimulator Needle      Needle Gauge: 21 and 21 G    Additional Needles:  Procedures: ultrasound guided (picture in chart) Supraclavicular block Narrative:  Injection made incrementally with aspirations every 5 mL.  Performed by: Personally  Anesthesiologist: Lauretta Grill  Additional Notes: Patient tolerated the procedure well without complications   Procedure Name: Intubation Date/Time: 09/26/2015 9:45 AM Performed by: Baxter Flattery Pre-anesthesia Checklist: Patient identified, Emergency Drugs available, Suction available and Patient being monitored Patient Re-evaluated:Patient Re-evaluated prior to inductionOxygen Delivery Method: Circle System Utilized Preoxygenation: Pre-oxygenation with 100% oxygen Intubation Type: IV induction Ventilation: Mask ventilation without difficulty Laryngoscope Size: Miller and 2 Grade View: Grade II Tube type: Oral Tube size: 8.0 mm Number of attempts: 1 Airway Equipment and Method: Stylet,  Oral airway and LTA kit utilized Placement Confirmation: ETT inserted through vocal cords under direct vision,  positive ETCO2 and breath sounds checked- equal and bilateral Secured at: 23 cm Tube secured with: Tape Dental Injury: Teeth and Oropharynx as per pre-operative assessment

## 2015-09-27 ENCOUNTER — Encounter (HOSPITAL_BASED_OUTPATIENT_CLINIC_OR_DEPARTMENT_OTHER): Payer: Self-pay | Admitting: Orthopaedic Surgery

## 2015-10-11 DIAGNOSIS — S52022D Displaced fracture of olecranon process without intraarticular extension of left ulna, subsequent encounter for closed fracture with routine healing: Secondary | ICD-10-CM | POA: Diagnosis not present

## 2015-10-11 DIAGNOSIS — S52022A Displaced fracture of olecranon process without intraarticular extension of left ulna, initial encounter for closed fracture: Secondary | ICD-10-CM | POA: Diagnosis not present

## 2015-10-16 DIAGNOSIS — M62432 Contracture of muscle, left forearm: Secondary | ICD-10-CM | POA: Diagnosis not present

## 2015-10-16 DIAGNOSIS — M25622 Stiffness of left elbow, not elsewhere classified: Secondary | ICD-10-CM | POA: Diagnosis not present

## 2015-10-16 DIAGNOSIS — M25522 Pain in left elbow: Secondary | ICD-10-CM | POA: Diagnosis not present

## 2015-10-16 DIAGNOSIS — M25422 Effusion, left elbow: Secondary | ICD-10-CM | POA: Diagnosis not present

## 2015-10-22 DIAGNOSIS — M25622 Stiffness of left elbow, not elsewhere classified: Secondary | ICD-10-CM | POA: Diagnosis not present

## 2015-10-22 DIAGNOSIS — M25522 Pain in left elbow: Secondary | ICD-10-CM | POA: Diagnosis not present

## 2015-10-22 DIAGNOSIS — M62432 Contracture of muscle, left forearm: Secondary | ICD-10-CM | POA: Diagnosis not present

## 2015-10-22 DIAGNOSIS — M25422 Effusion, left elbow: Secondary | ICD-10-CM | POA: Diagnosis not present

## 2015-10-24 DIAGNOSIS — M25522 Pain in left elbow: Secondary | ICD-10-CM | POA: Diagnosis not present

## 2015-10-24 DIAGNOSIS — M25622 Stiffness of left elbow, not elsewhere classified: Secondary | ICD-10-CM | POA: Diagnosis not present

## 2015-10-24 DIAGNOSIS — M25422 Effusion, left elbow: Secondary | ICD-10-CM | POA: Diagnosis not present

## 2015-10-24 DIAGNOSIS — M62432 Contracture of muscle, left forearm: Secondary | ICD-10-CM | POA: Diagnosis not present

## 2015-10-26 DIAGNOSIS — M62432 Contracture of muscle, left forearm: Secondary | ICD-10-CM | POA: Diagnosis not present

## 2015-10-26 DIAGNOSIS — M25522 Pain in left elbow: Secondary | ICD-10-CM | POA: Diagnosis not present

## 2015-10-26 DIAGNOSIS — M25622 Stiffness of left elbow, not elsewhere classified: Secondary | ICD-10-CM | POA: Diagnosis not present

## 2015-10-26 DIAGNOSIS — M25422 Effusion, left elbow: Secondary | ICD-10-CM | POA: Diagnosis not present

## 2015-10-29 DIAGNOSIS — M25422 Effusion, left elbow: Secondary | ICD-10-CM | POA: Diagnosis not present

## 2015-10-29 DIAGNOSIS — M25622 Stiffness of left elbow, not elsewhere classified: Secondary | ICD-10-CM | POA: Diagnosis not present

## 2015-10-29 DIAGNOSIS — M62432 Contracture of muscle, left forearm: Secondary | ICD-10-CM | POA: Diagnosis not present

## 2015-10-29 DIAGNOSIS — M25522 Pain in left elbow: Secondary | ICD-10-CM | POA: Diagnosis not present

## 2015-10-31 DIAGNOSIS — M25522 Pain in left elbow: Secondary | ICD-10-CM | POA: Diagnosis not present

## 2015-10-31 DIAGNOSIS — M25622 Stiffness of left elbow, not elsewhere classified: Secondary | ICD-10-CM | POA: Diagnosis not present

## 2015-10-31 DIAGNOSIS — M25422 Effusion, left elbow: Secondary | ICD-10-CM | POA: Diagnosis not present

## 2015-10-31 DIAGNOSIS — M62432 Contracture of muscle, left forearm: Secondary | ICD-10-CM | POA: Diagnosis not present

## 2015-11-02 DIAGNOSIS — M62432 Contracture of muscle, left forearm: Secondary | ICD-10-CM | POA: Diagnosis not present

## 2015-11-02 DIAGNOSIS — M25422 Effusion, left elbow: Secondary | ICD-10-CM | POA: Diagnosis not present

## 2015-11-02 DIAGNOSIS — M25522 Pain in left elbow: Secondary | ICD-10-CM | POA: Diagnosis not present

## 2015-11-02 DIAGNOSIS — M25622 Stiffness of left elbow, not elsewhere classified: Secondary | ICD-10-CM | POA: Diagnosis not present

## 2015-11-06 ENCOUNTER — Emergency Department (HOSPITAL_COMMUNITY): Payer: Medicare Other

## 2015-11-06 ENCOUNTER — Encounter (HOSPITAL_COMMUNITY): Payer: Self-pay

## 2015-11-06 ENCOUNTER — Inpatient Hospital Stay (HOSPITAL_COMMUNITY)
Admission: EM | Admit: 2015-11-06 | Discharge: 2015-11-13 | DRG: 871 | Disposition: A | Discharge status: Home Health | Payer: Medicare Other | Attending: Internal Medicine | Admitting: Internal Medicine

## 2015-11-06 DIAGNOSIS — N390 Urinary tract infection, site not specified: Secondary | ICD-10-CM | POA: Diagnosis not present

## 2015-11-06 DIAGNOSIS — N189 Chronic kidney disease, unspecified: Secondary | ICD-10-CM | POA: Diagnosis not present

## 2015-11-06 DIAGNOSIS — M79632 Pain in left forearm: Secondary | ICD-10-CM | POA: Diagnosis not present

## 2015-11-06 DIAGNOSIS — R339 Retention of urine, unspecified: Secondary | ICD-10-CM | POA: Diagnosis present

## 2015-11-06 DIAGNOSIS — M79603 Pain in arm, unspecified: Secondary | ICD-10-CM | POA: Diagnosis present

## 2015-11-06 DIAGNOSIS — I4430 Unspecified atrioventricular block: Secondary | ICD-10-CM | POA: Diagnosis not present

## 2015-11-06 DIAGNOSIS — I10 Essential (primary) hypertension: Secondary | ICD-10-CM | POA: Diagnosis present

## 2015-11-06 DIAGNOSIS — G4730 Sleep apnea, unspecified: Secondary | ICD-10-CM | POA: Diagnosis not present

## 2015-11-06 DIAGNOSIS — R509 Fever, unspecified: Secondary | ICD-10-CM | POA: Diagnosis not present

## 2015-11-06 DIAGNOSIS — I442 Atrioventricular block, complete: Secondary | ICD-10-CM | POA: Diagnosis not present

## 2015-11-06 DIAGNOSIS — B952 Enterococcus as the cause of diseases classified elsewhere: Secondary | ICD-10-CM | POA: Diagnosis not present

## 2015-11-06 DIAGNOSIS — N138 Other obstructive and reflux uropathy: Secondary | ICD-10-CM | POA: Diagnosis present

## 2015-11-06 DIAGNOSIS — Z6831 Body mass index (BMI) 31.0-31.9, adult: Secondary | ICD-10-CM

## 2015-11-06 DIAGNOSIS — A499 Bacterial infection, unspecified: Secondary | ICD-10-CM | POA: Diagnosis not present

## 2015-11-06 DIAGNOSIS — J181 Lobar pneumonia, unspecified organism: Secondary | ICD-10-CM | POA: Diagnosis present

## 2015-11-06 DIAGNOSIS — N400 Enlarged prostate without lower urinary tract symptoms: Secondary | ICD-10-CM | POA: Diagnosis not present

## 2015-11-06 DIAGNOSIS — E669 Obesity, unspecified: Secondary | ICD-10-CM | POA: Diagnosis present

## 2015-11-06 DIAGNOSIS — N3090 Cystitis, unspecified without hematuria: Secondary | ICD-10-CM | POA: Diagnosis present

## 2015-11-06 DIAGNOSIS — A4181 Sepsis due to Enterococcus: Principal | ICD-10-CM | POA: Diagnosis present

## 2015-11-06 DIAGNOSIS — E872 Acidosis: Secondary | ICD-10-CM | POA: Diagnosis present

## 2015-11-06 DIAGNOSIS — N401 Enlarged prostate with lower urinary tract symptoms: Secondary | ICD-10-CM | POA: Diagnosis present

## 2015-11-06 DIAGNOSIS — R338 Other retention of urine: Secondary | ICD-10-CM | POA: Diagnosis present

## 2015-11-06 DIAGNOSIS — J188 Other pneumonia, unspecified organism: Secondary | ICD-10-CM | POA: Diagnosis not present

## 2015-11-06 DIAGNOSIS — J189 Pneumonia, unspecified organism: Secondary | ICD-10-CM | POA: Diagnosis not present

## 2015-11-06 DIAGNOSIS — B3749 Other urogenital candidiasis: Secondary | ICD-10-CM | POA: Diagnosis present

## 2015-11-06 DIAGNOSIS — G4733 Obstructive sleep apnea (adult) (pediatric): Secondary | ICD-10-CM | POA: Diagnosis present

## 2015-11-06 DIAGNOSIS — K700 Alcoholic fatty liver: Secondary | ICD-10-CM | POA: Diagnosis present

## 2015-11-06 DIAGNOSIS — F17210 Nicotine dependence, cigarettes, uncomplicated: Secondary | ICD-10-CM | POA: Diagnosis present

## 2015-11-06 DIAGNOSIS — R140 Abdominal distension (gaseous): Secondary | ICD-10-CM | POA: Diagnosis not present

## 2015-11-06 DIAGNOSIS — N3000 Acute cystitis without hematuria: Secondary | ICD-10-CM | POA: Diagnosis present

## 2015-11-06 DIAGNOSIS — I441 Atrioventricular block, second degree: Secondary | ICD-10-CM | POA: Diagnosis not present

## 2015-11-06 DIAGNOSIS — R7881 Bacteremia: Secondary | ICD-10-CM | POA: Diagnosis not present

## 2015-11-06 DIAGNOSIS — K5900 Constipation, unspecified: Secondary | ICD-10-CM | POA: Diagnosis present

## 2015-11-06 DIAGNOSIS — S52125A Nondisplaced fracture of head of left radius, initial encounter for closed fracture: Secondary | ICD-10-CM | POA: Diagnosis not present

## 2015-11-06 DIAGNOSIS — A419 Sepsis, unspecified organism: Secondary | ICD-10-CM | POA: Diagnosis present

## 2015-11-06 DIAGNOSIS — N1831 Chronic kidney disease, stage 3a: Secondary | ICD-10-CM | POA: Diagnosis present

## 2015-11-06 DIAGNOSIS — Z86718 Personal history of other venous thrombosis and embolism: Secondary | ICD-10-CM

## 2015-11-06 DIAGNOSIS — Z452 Encounter for adjustment and management of vascular access device: Secondary | ICD-10-CM | POA: Diagnosis not present

## 2015-11-06 DIAGNOSIS — K089 Disorder of teeth and supporting structures, unspecified: Secondary | ICD-10-CM | POA: Diagnosis not present

## 2015-11-06 DIAGNOSIS — J9601 Acute respiratory failure with hypoxia: Secondary | ICD-10-CM | POA: Diagnosis not present

## 2015-11-06 DIAGNOSIS — N183 Chronic kidney disease, stage 3 (moderate): Secondary | ICD-10-CM | POA: Diagnosis present

## 2015-11-06 DIAGNOSIS — Z5181 Encounter for therapeutic drug level monitoring: Secondary | ICD-10-CM | POA: Diagnosis not present

## 2015-11-06 DIAGNOSIS — I129 Hypertensive chronic kidney disease with stage 1 through stage 4 chronic kidney disease, or unspecified chronic kidney disease: Secondary | ICD-10-CM | POA: Diagnosis present

## 2015-11-06 DIAGNOSIS — Z87891 Personal history of nicotine dependence: Secondary | ICD-10-CM | POA: Diagnosis not present

## 2015-11-06 DIAGNOSIS — S52002A Unspecified fracture of upper end of left ulna, initial encounter for closed fracture: Secondary | ICD-10-CM | POA: Diagnosis not present

## 2015-11-06 LAB — URINE MICROSCOPIC-ADD ON

## 2015-11-06 LAB — CBC WITH DIFFERENTIAL/PLATELET
BASOS ABS: 0 10*3/uL (ref 0.0–0.1)
BASOS PCT: 0 %
Eosinophils Absolute: 0.1 10*3/uL (ref 0.0–0.7)
Eosinophils Relative: 1 %
HEMATOCRIT: 50.3 % (ref 39.0–52.0)
HEMOGLOBIN: 16 g/dL (ref 13.0–17.0)
LYMPHS PCT: 8 %
Lymphs Abs: 0.7 10*3/uL (ref 0.7–4.0)
MCH: 26.9 pg (ref 26.0–34.0)
MCHC: 31.8 g/dL (ref 30.0–36.0)
MCV: 84.5 fL (ref 78.0–100.0)
Monocytes Absolute: 0.1 10*3/uL (ref 0.1–1.0)
Monocytes Relative: 1 %
NEUTROS ABS: 8.9 10*3/uL — AB (ref 1.7–7.7)
NEUTROS PCT: 90 %
Platelets: 152 10*3/uL (ref 150–400)
RBC: 5.95 MIL/uL — AB (ref 4.22–5.81)
RDW: 13.8 % (ref 11.5–15.5)
WBC: 9.8 10*3/uL (ref 4.0–10.5)

## 2015-11-06 LAB — INFLUENZA PANEL BY PCR (TYPE A & B)
H1N1FLUPCR: NOT DETECTED
INFLBPCR: NEGATIVE
Influenza A By PCR: NEGATIVE

## 2015-11-06 LAB — EXPECTORATED SPUTUM ASSESSMENT W REFEX TO RESP CULTURE

## 2015-11-06 LAB — URINALYSIS, ROUTINE W REFLEX MICROSCOPIC
Bilirubin Urine: NEGATIVE
GLUCOSE, UA: NEGATIVE mg/dL
KETONES UR: NEGATIVE mg/dL
Nitrite: NEGATIVE
PROTEIN: 30 mg/dL — AB
Specific Gravity, Urine: 1.011 (ref 1.005–1.030)
pH: 5.5 (ref 5.0–8.0)

## 2015-11-06 LAB — COMPREHENSIVE METABOLIC PANEL
ALBUMIN: 4 g/dL (ref 3.5–5.0)
ALK PHOS: 100 U/L (ref 38–126)
ALT: 19 U/L (ref 17–63)
ANION GAP: 17 — AB (ref 5–15)
AST: 30 U/L (ref 15–41)
BILIRUBIN TOTAL: 0.9 mg/dL (ref 0.3–1.2)
BUN: 20 mg/dL (ref 6–20)
CALCIUM: 9.3 mg/dL (ref 8.9–10.3)
CO2: 19 mmol/L — ABNORMAL LOW (ref 22–32)
Chloride: 103 mmol/L (ref 101–111)
Creatinine, Ser: 2 mg/dL — ABNORMAL HIGH (ref 0.61–1.24)
GFR calc non Af Amer: 35 mL/min — ABNORMAL LOW (ref >60)
GFR, EST AFRICAN AMERICAN: 40 mL/min — AB (ref >60)
GLUCOSE: 118 mg/dL — AB (ref 65–99)
POTASSIUM: 4.1 mmol/L (ref 3.5–5.1)
SODIUM: 139 mmol/L (ref 135–145)
TOTAL PROTEIN: 7.3 g/dL (ref 6.5–8.1)

## 2015-11-06 LAB — I-STAT CG4 LACTIC ACID, ED
LACTIC ACID, VENOUS: 4.9 mmol/L — AB (ref 0.5–2.0)
LACTIC ACID, VENOUS: 2.21 mmol/L — AB (ref 0.5–2.0)

## 2015-11-06 LAB — LACTIC ACID, PLASMA
LACTIC ACID, VENOUS: 2.4 mmol/L — AB (ref 0.5–2.0)
LACTIC ACID, VENOUS: 1.7 mmol/L (ref 0.5–2.0)

## 2015-11-06 LAB — GRAM STAIN

## 2015-11-06 LAB — HIV ANTIBODY (ROUTINE TESTING W REFLEX): HIV SCREEN 4TH GENERATION: NONREACTIVE

## 2015-11-06 LAB — STREP PNEUMONIAE URINARY ANTIGEN: STREP PNEUMO URINARY ANTIGEN: NEGATIVE

## 2015-11-06 LAB — I-STAT TROPONIN, ED: Troponin i, poc: 0.01 ng/mL (ref 0.00–0.08)

## 2015-11-06 LAB — BRAIN NATRIURETIC PEPTIDE: B NATRIURETIC PEPTIDE 5: 18.8 pg/mL (ref 0.0–100.0)

## 2015-11-06 LAB — EXPECTORATED SPUTUM ASSESSMENT W GRAM STAIN, RFLX TO RESP C

## 2015-11-06 MED ORDER — VANCOMYCIN HCL 10 G IV SOLR
1750.0000 mg | Freq: Once | INTRAVENOUS | Status: AC
  Administered 2015-11-06: 1750 mg via INTRAVENOUS
  Filled 2015-11-06: qty 1750

## 2015-11-06 MED ORDER — ONDANSETRON HCL 4 MG PO TABS: 4.0000 mg | ORAL_TABLET | Freq: Three times a day (TID) | ORAL | Status: DC | PRN

## 2015-11-06 MED ORDER — ALBUTEROL SULFATE HFA 108 (90 BASE) MCG/ACT IN AERS: 2.0000 | INHALATION_SPRAY | Freq: Four times a day (QID) | RESPIRATORY_TRACT | Status: DC | PRN

## 2015-11-06 MED ORDER — METHOCARBAMOL 500 MG PO TABS: 750.0000 mg | ORAL_TABLET | Freq: Two times a day (BID) | ORAL | Status: DC | PRN

## 2015-11-06 MED ORDER — OXYCODONE-ACETAMINOPHEN 5-325 MG PO TABS
1.0000 | ORAL_TABLET | ORAL | Status: DC | PRN
  Administered 2015-11-12: 2 via ORAL
  Filled 2015-11-06: qty 2

## 2015-11-06 MED ORDER — ACETAMINOPHEN 325 MG PO TABS: 650.0000 mg | ORAL_TABLET | Freq: Four times a day (QID) | ORAL | Status: DC | PRN

## 2015-11-06 MED ORDER — CEFTRIAXONE SODIUM 1 G IJ SOLR
1.0000 g | INTRAMUSCULAR | Status: DC
  Administered 2015-11-06 – 2015-11-07 (×2): 1 g via INTRAVENOUS
  Filled 2015-11-06 (×2): qty 10

## 2015-11-06 MED ORDER — SODIUM CHLORIDE 0.9 % IV SOLN
INTRAVENOUS | Status: DC
  Administered 2015-11-06 – 2015-11-11 (×5): via INTRAVENOUS

## 2015-11-06 MED ORDER — SODIUM CHLORIDE 0.9 % IV BOLUS (SEPSIS)
1000.0000 mL | Freq: Once | INTRAVENOUS | Status: AC
  Administered 2015-11-06: 1000 mL via INTRAVENOUS

## 2015-11-06 MED ORDER — ALBUTEROL SULFATE (2.5 MG/3ML) 0.083% IN NEBU: 2.5000 mg | INHALATION_SOLUTION | Freq: Four times a day (QID) | RESPIRATORY_TRACT | Status: DC | PRN

## 2015-11-06 MED ORDER — OXYCODONE HCL ER 10 MG PO T12A
10.0000 mg | EXTENDED_RELEASE_TABLET | Freq: Two times a day (BID) | ORAL | Status: DC
  Administered 2015-11-06 – 2015-11-13 (×15): 10 mg via ORAL
  Filled 2015-11-06 (×15): qty 1

## 2015-11-06 MED ORDER — HEPARIN SODIUM (PORCINE) 5000 UNIT/ML IJ SOLN
5000.0000 [IU] | Freq: Three times a day (TID) | INTRAMUSCULAR | Status: DC
  Administered 2015-11-06 – 2015-11-13 (×21): 5000 [IU] via SUBCUTANEOUS
  Filled 2015-11-06 (×18): qty 1

## 2015-11-06 MED ORDER — VANCOMYCIN HCL IN DEXTROSE 1-5 GM/200ML-% IV SOLN
1000.0000 mg | Freq: Once | INTRAVENOUS | Status: DC
  Filled 2015-11-06: qty 200

## 2015-11-06 MED ORDER — SODIUM CHLORIDE 0.9 % IV BOLUS (SEPSIS)
500.0000 mL | INTRAVENOUS | Status: AC
  Administered 2015-11-06: 500 mL via INTRAVENOUS

## 2015-11-06 MED ORDER — ACETAMINOPHEN 500 MG PO TABS
1000.0000 mg | ORAL_TABLET | Freq: Once | ORAL | Status: AC
Start: 2015-11-06 — End: 2015-11-06
  Administered 2015-11-06: 1000 mg via ORAL
  Filled 2015-11-06: qty 2

## 2015-11-06 MED ORDER — DEXTROSE 5 % IV SOLN
500.0000 mg | INTRAVENOUS | Status: DC
  Administered 2015-11-06 – 2015-11-07 (×2): 500 mg via INTRAVENOUS
  Filled 2015-11-06 (×2): qty 500

## 2015-11-06 MED ORDER — SODIUM CHLORIDE 0.9 % IV BOLUS (SEPSIS)
1000.0000 mL | INTRAVENOUS | Status: AC
  Administered 2015-11-06 (×3): 1000 mL via INTRAVENOUS

## 2015-11-06 MED ORDER — PIPERACILLIN-TAZOBACTAM 3.375 G IVPB 30 MIN
3.3750 g | Freq: Once | INTRAVENOUS | Status: AC
  Administered 2015-11-06: 3.375 g via INTRAVENOUS
  Filled 2015-11-06: qty 50

## 2015-11-06 NOTE — Progress Notes (Signed)
Patient seen and examined, admitted by Dr. Julian Reil this a.m. briefly 60 year old male initially presented for urinary retention however developed fevers and rigors, hypoxia, shortness of breath with greenish productive phlegm in ED.  BP 120/82 mmHg  Pulse 89  Temp(Src) 100.7 F (38.2 C) (Oral)  Resp 20  Ht 6' 1.5" (1.867 m)  Wt 109.77 kg (242 lb)  BMI 31.49 kg/m2  SpO2 97%  Chest x-ray showed bilateral pneumonia  1) severe bilateral pneumonia, community-acquired rule out influenza - Continue IV vancomycin and Zosyn - follow influenza panel - Follow Blood cultures, urine legionella antigen, urine strep antigen negative -  IV fluids  2) urinary retention,? UTI - Continue IV vanc and Zosyn, follow urine culture and sensitivities, continue Foley  3) sepsis, lactic acidosis likely due to #1 - Continue IV fluid hydration, antibiotics, follow blood cultures  DVT prophylaxis: Heparin subcutaneous   Lee-Ann Gal M.D. [facility] 11/06/2015, 10:38 AM  Pager: [phone]

## 2015-11-06 NOTE — ED Notes (Signed)
Pt states he has not been able to urinate since Monday night at 2100. Not even able to dribble. Pt in a lot of pain.

## 2015-11-06 NOTE — Progress Notes (Signed)
Pharmacy Antibiotic Note  Malik Mullins is a 60 y.o. male admitted on 11/06/2015 with sepsis.  Pharmacy has been consulted for vancomycin and zosyn dosing.  Pt received vancomycin 1g and zosyn 3.375g IV once in the ED.  Plan: Vancomycin  IV every 24 hours.  Goal trough 15-20 mcg/mL. Zosyn 3.375g IV q8h (4 hour infusion).  Monitor culture data, renal function and clinical course VT at [facility] prn  Height: 6' 1.5" (186.7 cm) Weight: 242 lb 7 oz (109.969 kg) IBW/kg (Calculated) : 81.05  Temp (24hrs), Avg:99.4 F (37.4 C), Min:97.5 F (36.4 C), Max:103 F (39.4 C)  No results for input(s): WBC, CREATININE, LATICACIDVEN, VANCOTROUGH, VANCOPEAK, VANCORANDOM, GENTTROUGH, GENTPEAK, GENTRANDOM, TOBRATROUGH, TOBRAPEAK, TOBRARND, AMIKACINPEAK, AMIKACINTROU, AMIKACIN in the last 168 hours.  CrCl cannot be calculated (Patient has no serum creatinine result on file.).    Allergies  Allergen Reactions  . Lisinopril Swelling  . Tramadol Nausea And Vomiting    Antimicrobials this admission: Vanc 2/21 >>  Zosyn 2/21 >>   Dose adjustments this admission: n/a  Microbiology results:  BCx:   UCx:    Sputum:    MRSA PCR:    Arlean Hopping. Newman Pies, PharmD, BCPS Clinical Pharmacist Pager [phone] 11/06/2015 3:05 AM

## 2015-11-06 NOTE — ED Notes (Signed)
Heart healthy lunch tray ordered 

## 2015-11-06 NOTE — H&P (Signed)
[facility] History and Physical  Malik Mullins MVH:[id number] DOB: [birth year] DOA: 11/06/2015  Referring physician: EDP PCP: PROVIDER NOT IN SYSTEM   Chief Complaint: Urinary retention   HPI: Malik Mullins is a 60 y.o. male who presents to the ED initially for urinary retention.  He has no new back pain or extremity pain / numbness.  This is alleviated by placement of a foley catheter.  Although other than urinary retention he had felt fine coming in to the ED initially, while the patient was in the ED, he developed rigors with fever of 103.  The patient also desaturated to the 70s on room air despite being wide awake, and developed cough, SOB, with greenish sputum production.  Work up in the ED demonstrates bibasilar infiltrates concerning for PNA.  Review of Systems: Systems reviewed.  As above, otherwise negative  Past Medical History  Diagnosis Date  . Hypertension   . Back pain   . DVT (deep venous thrombosis) (HCC)     left leg and right leg  . Knee joint pain   . Pneumonia    Past Surgical History  Procedure Laterality Date  . Mandible surgery    . Orif elbow fracture Left 09/26/2015    Procedure: OPEN REDUCTION INTERNAL FIXATION (ORIF) LEFT OLECRANON FRACTURE;  Surgeon: Malik Kos, MD;  Location: [facility];  Service: Orthopedics;  Laterality: Left;   Social History:  reports that he has been smoking Cigarettes.  He has a 12 pack-year smoking history. He has never used smokeless tobacco. He reports that he drinks alcohol. He reports that he does not use illicit drugs.  Allergies  Allergen Reactions  . Lisinopril Swelling  . Tramadol Nausea And Vomiting    Family History  Problem Relation Age of Onset  . Hypertension Mother   . Diabetes Father      Prior to Admission medications   Medication Sig Start Date End Date Taking? Authorizing Provider  albuterol (PROVENTIL HFA;VENTOLIN HFA) 108 (90 BASE) MCG/ACT inhaler Inhale 2 puffs  into the lungs every 6 (six) hours as needed for wheezing or shortness of breath.    Yes Historical Provider, MD  hydrALAZINE (APRESOLINE) 10 MG tablet Take 20 mg by mouth 2 (two) times daily.    Yes Historical Provider, MD  methocarbamol (ROBAXIN) 750 MG tablet Take 1 tablet (750 mg total) by mouth 2 (two) times daily as needed for muscle spasms. 09/26/15  Yes Naiping Donnelly Stager, MD  Multiple Vitamin (MULTIVITAMIN WITH MINERALS) TABS Take 1 tablet by mouth every morning.    Yes Historical Provider, MD  ondansetron (ZOFRAN) 4 MG tablet Take 1-2 tablets (4-8 mg total) by mouth every 8 (eight) hours as needed for nausea or vomiting. 09/26/15  Yes Naiping Donnelly Stager, MD  oxyCODONE (OXYCONTIN) 10 mg 12 hr tablet Take 1 tablet (10 mg total) by mouth every 12 (twelve) hours. 09/26/15  Yes Naiping Donnelly Stager, MD  oxyCODONE-acetaminophen (PERCOCET) 5-325 MG tablet Take 1-2 tablets by mouth every 4 (four) hours as needed for severe pain. 09/26/15  Yes Malik Kos, MD   Physical Exam: Filed Vitals:   11/06/15 0432 11/06/15 0446  BP:  99/75  Pulse:  104  Temp: 101.1 F (38.4 C) 100.7 F (38.2 C)  Resp:      BP 99/75 mmHg  Pulse 104  Temp(Src) 100.7 F (38.2 C) (Oral)  Resp 25  Ht 6' 1.5" (1.867 m)  Wt 109.77 kg (242 lb)  BMI 31.49 kg/m2  SpO2 97%  General Appearance:    Alert, oriented, no distress, appears stated age  Head:    Normocephalic, atraumatic  Eyes:    PERRL, EOMI, sclera non-icteric        Nose:   Nares without drainage or epistaxis. Mucosa, turbinates normal  Throat:   Moist mucous membranes. Oropharynx without erythema or exudate.  Neck:   Supple. No carotid bruits.  No thyromegaly.  No lymphadenopathy.   Back:     No CVA tenderness, no spinal tenderness  Lungs:     Diminished at bilateral bases.  Chest wall:    No tenderness to palpitation  Heart:    Regular rate and rhythm without murmurs, gallops, rubs  Abdomen:     Soft, non-tender, nondistended, normal bowel sounds, no organomegaly   Genitalia:    deferred  Rectal:    deferred  Extremities:   No clubbing, cyanosis or edema.  Pulses:   2+ and symmetric all extremities  Skin:   Skin color, texture, turgor normal, no rashes or lesions  Lymph nodes:   Cervical, supraclavicular, and axillary nodes normal  Neurologic:   CNII-XII intact. Normal strength, sensation and reflexes      throughout    Labs on Admission:  Basic Metabolic Panel:  Recent Labs Lab 11/06/15 0324  NA 139  K 4.1  CL 103  CO2 19*  GLUCOSE 118*  BUN 20  CREATININE 2.00*  CALCIUM 9.3   Liver Function Tests:  Recent Labs Lab 11/06/15 0324  AST 30  ALT 19  ALKPHOS 100  BILITOT 0.9  PROT 7.3  ALBUMIN 4.0   No results for input(s): LIPASE, AMYLASE in the last 168 hours. No results for input(s): AMMONIA in the last 168 hours. CBC:  Recent Labs Lab 11/06/15 0324  WBC 9.8  NEUTROABS 8.9*  HGB 16.0  HCT 50.3  MCV 84.5  PLT 152   Cardiac Enzymes: No results for input(s): CKTOTAL, CKMB, CKMBINDEX, TROPONINI in the last 168 hours.  BNP (last 3 results) No results for input(s): PROBNP in the last 8760 hours. CBG: No results for input(s): GLUCAP in the last 168 hours.  Radiological Exams on Admission: Dg Chest Port 1 View  11/06/2015  CLINICAL DATA:  Acute onset of fever and hypoxia. Initial encounter. EXAM: PORTABLE CHEST 1 VIEW COMPARISON:  Chest radiograph performed 09/25/2014 FINDINGS: The lungs are well-aerated. Mild vascular congestion is noted. Mild bibasilar opacities may reflect atelectasis or possibly mild infection, depending on the patient's symptoms. There is no evidence of pleural effusion or pneumothorax. The cardiomediastinal silhouette is mildly enlarged. No acute osseous abnormalities are seen. IMPRESSION: Mild vascular congestion and mild cardiomegaly. Bibasilar opacities may reflect atelectasis or possibly mild infection, depending on the patient's symptoms. Electronically Signed   By: Roanna Raider M.D.   On:  11/06/2015 04:18    EKG: Independently reviewed.  Assessment/Plan Principal Problem:   Bilateral pneumonia Active Problems:   CAP (community acquired pneumonia)   Sepsis (HCC)   1. Bilateral pneumonia - 1. CAP pathway 2. Influenza panel pending 3. Rocephin and azithromycin 2. Sepsis 1. Cultures pending 2. IVF 3. Tylenol PRN fever 4. Trend lactic acid 3. Urinary retention - 1. Foley in place and draining 2. Likely secondary to BPH 3. Needs follow up with urology after this admission.    Code Status: Full  Family Communication: No family in room Disposition Plan: Admit to inpatient   Time spent: 70 min  Malik Mullins M. [facility] Pager [phone]  If  7AM-7PM, please contact the day team taking care of the patient [email or web address] Password [facility] 11/06/2015, 5:48 AM

## 2015-11-06 NOTE — ED Notes (Signed)
Vanc dosage changed; waiting for med from pharmacy

## 2015-11-06 NOTE — ED Notes (Signed)
Catheter insertion attempted. Resistance met after after 100-150 of urine present. Over  61m urine still present in bladder per scan, MD aware. Alternate catheter necessary.

## 2015-11-06 NOTE — ED Provider Notes (Addendum)
By signing my name below, I, Arlan Organ, attest that this documentation has been prepared under the direction and in the presence of Kristen N Ward, DO.  Electronically Signed: Arlan Organ, ED Scribe. 11/06/2015. 1:22 AM.   TIME SEEN: 1:20 AM   CHIEF COMPLAINT:  Chief Complaint  Patient presents with  . Urinary Retention     HPI:  HPI Comments: Malik Mullins is a 60 y.o. male with a PMHx of HTN and DVT 3 years ago no longer on Xarelto who presents to the Emergency Department complaining of constant, ongoing urinary retention onset last night. Pt denies any prior history of same. He also reports pressure like discomfort to his abdomen. No aggravating or alleviating factors at this time. PT denies taking excessive doses of Benadryl or other antihistamine. No recent medication changes. However, pt has been taking prescribed pain medication for recent left elbow surgery. No recent fever, chills, nausea, or vomiting. Pt has been off of Xarelto for 3 years now but according to records patient had a DVT in his lower extremity in January 2016. No known history of BPH or prostate cancer. No new back pain, numbness, tingling or focal weakness. No bowel or bladder incontinence.  PCP: PROVIDER NOT IN SYSTEM    ROS: See HPI Constitutional: no fever  Eyes: no drainage  ENT: no runny nose   Cardiovascular:  no chest pain  Resp: no SOB  GI: no vomiting. Positive abdominal pain GU: no dysuria. Positive difficulty urinating  Integumentary: no rash  Allergy: no hives  Musculoskeletal: no leg swelling  Neurological: no slurred speech ROS otherwise negative  PAST MEDICAL HISTORY/PAST SURGICAL HISTORY:  Past Medical History  Diagnosis Date  . Hypertension   . Back pain   . DVT (deep venous thrombosis) (HCC)     left leg and right leg  . Knee joint pain   . Pneumonia     MEDICATIONS:  Prior to Admission medications   Medication Sig Start Date End Date Taking? Authorizing Provider   albuterol (PROVENTIL HFA;VENTOLIN HFA) 108 (90 BASE) MCG/ACT inhaler Inhale 2 puffs into the lungs every 6 (six) hours as needed for wheezing or shortness of breath.     Historical Provider, MD  hydrALAZINE (APRESOLINE) 10 MG tablet Take 10 mg by mouth 3 (three) times daily.    Historical Provider, MD  HYDROcodone-acetaminophen (NORCO) 5-325 MG per tablet Take 1 tablet by mouth every 6 (six) hours as needed. 04/06/13   Tatyana Kirichenko, PA-C  methocarbamol (ROBAXIN) 750 MG tablet Take 1 tablet (750 mg total) by mouth 2 (two) times daily as needed for muscle spasms. 09/26/15   Tarry Kos, MD  Multiple Vitamin (MULTIVITAMIN WITH MINERALS) TABS Take 1 tablet by mouth every morning.     Historical Provider, MD  ondansetron (ZOFRAN) 4 MG tablet Take 1-2 tablets (4-8 mg total) by mouth every 8 (eight) hours as needed for nausea or vomiting. 09/26/15   Tarry Kos, MD  oxyCODONE (OXYCONTIN) 10 mg 12 hr tablet Take 1 tablet (10 mg total) by mouth every 12 (twelve) hours. 09/26/15   Naiping Donnelly Stager, MD  oxyCODONE-acetaminophen (PERCOCET) 5-325 MG tablet Take 1-2 tablets by mouth every 4 (four) hours as needed for severe pain. 09/26/15   Naiping Donnelly Stager, MD  oxyCODONE-acetaminophen (PERCOCET/ROXICET) 5-325 MG tablet Take 1-2 tablets by mouth every 6 (six) hours as needed for severe pain. 09/24/15   Melton Krebs, PA-C  Rivaroxaban (XARELTO) 20 MG TABS Take 20 mg by mouth  every morning.     Historical Provider, MD  senna-docusate (SENOKOT S) 8.6-50 MG tablet Take 1 tablet by mouth at bedtime as needed. 09/26/15   Naiping Donnelly Stager, MD    ALLERGIES:  Allergies  Allergen Reactions  . Lisinopril Swelling  . Tramadol Nausea And Vomiting    SOCIAL HISTORY:  Social History  Substance Use Topics  . Smoking status: Current Some Day Smoker -- 0.30 packs/day for 40 years    Types: Cigarettes  . Smokeless tobacco: Never Used  . Alcohol Use: Yes     Comment: "from time to time"    FAMILY HISTORY: Family History   Problem Relation Age of Onset  . Hypertension Mother   . Diabetes Father     EXAM: BP 203/165 mmHg  Pulse 100  Temp(Src) 97.5 F (36.4 C) (Oral)  Resp 22  Ht 6' 1.5" (1.867 m)  Wt 242 lb 7 oz (109.969 kg)  BMI 31.55 kg/m2  SpO2 100% CONSTITUTIONAL: Alert and oriented and responds appropriately to questions. Well-appearing; well-nourished HEAD: Normocephalic EYES: Conjunctivae clear, PERRL ENT: normal nose; no rhinorrhea; moist mucous membranes; pharynx without lesions noted NECK: Supple, no meningismus, no LAD  CARD: RRR; S1 and S2 appreciated; no murmurs, no clicks, no rubs, no gallops RESP: Normal chest excursion without splinting or tachypnea; breath sounds clear and equal bilaterally; no wheezes, no rhonchi, no rales, no hypoxia or respiratory distress, speaking full sentences ABD/GI: Normal bowel sounds; non-distended; soft, non-tender, no rebound, no guarding, no peritoneal signs GU: Circumcised male, no testicular swelling, masses. Patient does have some overflow incontinence with some bleeding. Nursing staff having difficulty passing Foley catheter due to resistance. RECTAL:  Normal rectal tone without gross blood or melena, unable to palpate prostate as I'm unable to reach far enough into his rectum, nontender rectal exam BACK:  The back appears normal and is non-tender to palpation, there is no CVA tenderness EXT: Normal ROM in all joints; non-tender to palpation; no edema; normal capillary refill; no cyanosis, no calf tenderness or swelling    SKIN: Normal color for age and race; warm NEURO: Moves all extremities equally, sensation to light touch intact diffusely, cranial nerves II through XII intact PSYCH: The patient's mood and manner are appropriate. Grooming and personal hygiene are appropriate.  MEDICAL DECISION MAKING: Patient here with urinary retention which I feels likely secondary to BPH. He is heme dynamically stable. Patient will need a coud catheter as we are  unable to place a normal Foley catheter. Patient had over 800 mL of urine in his bladder on bladder scan. Last urinated at 9 PM last night.  ED PROGRESS: 3:00 AM  Nursing staff informing the patient is now having oral temperature of 103 and dropped his oxygen saturation to the 70s while on room air at rest and awake. We'll start a septic workup. Will give IV fluids, broad-spectrum anabiotic, obtain labs, cultures, urine, chest x-ray, flu swab. Patient will need admission given his new oxygen requirement.  Patient reports he does have chest pain that he is unable to describe, shortness of breath and cough with green sputum production. He is unaware that he has had fevers. He has chills currently. States he did have an influenza vaccination this year. Coud catheter placed with return of blood-tinged urine.   5:00 AM  Pt's chest x-ray shows basilar infiltrates concerning for pneumonia. Patient will need admission to step down. We'll discuss with hospice. He states he does have a primary care physician but cannot remember  their name. He has received broad-spectrum antibiotics and IV fluids.  5:30 AM  D/w Dr. Julian Reil with hospitalist service who agrees on admission to step down bed. I will place holding orders per his request. Care transferred hospitalist service. Patient updated with plan. He has Foley catheter in place.      EKG Interpretation  Date/Time:  Tuesday November 06 2015 03:16:38 EST Ventricular Rate:  99 PR Interval:  158 QRS Duration: 114 QT Interval:  348 QTC Calculation: 447 R Axis:   -32 Text Interpretation:  Sinus rhythm Ventricular premature complex Borderline IVCD with LAD No significant change since last tracing Artifact Confirmed by WARD,  DO, KRISTEN ([id number]) on 11/06/2015 3:40:33 AM        CRITICAL CARE Performed by: Raelyn Number   Total critical care time: 45 minutes  Critical care time was exclusive of separately billable procedures and treating other  patients.  Critical care was necessary to treat or prevent imminent or life-threatening deterioration.  Critical care was time spent personally by me on the following activities: development of treatment plan with patient and/or surrogate as well as nursing, discussions with consultants, evaluation of patient's response to treatment, examination of patient, obtaining history from patient or surrogate, ordering and performing treatments and interventions, ordering and review of laboratory studies, ordering and review of radiographic studies, pulse oximetry and re-evaluation of patient's condition.    I personally performed the services described in this documentation, which was scribed in my presence. The recorded information has been reviewed and is accurate.    Layla Maw Ward, DO 11/06/15 [phone]

## 2015-11-07 ENCOUNTER — Inpatient Hospital Stay (HOSPITAL_COMMUNITY): Payer: Medicare Other

## 2015-11-07 DIAGNOSIS — A499 Bacterial infection, unspecified: Secondary | ICD-10-CM

## 2015-11-07 DIAGNOSIS — K089 Disorder of teeth and supporting structures, unspecified: Secondary | ICD-10-CM | POA: Insufficient documentation

## 2015-11-07 DIAGNOSIS — J189 Pneumonia, unspecified organism: Secondary | ICD-10-CM

## 2015-11-07 DIAGNOSIS — N183 Chronic kidney disease, stage 3 (moderate): Secondary | ICD-10-CM

## 2015-11-07 DIAGNOSIS — I4430 Unspecified atrioventricular block: Secondary | ICD-10-CM

## 2015-11-07 LAB — BASIC METABOLIC PANEL
Anion gap: 12 (ref 5–15)
BUN: 12 mg/dL (ref 6–20)
CO2: 21 mmol/L — ABNORMAL LOW (ref 22–32)
Calcium: 8.6 mg/dL — ABNORMAL LOW (ref 8.9–10.3)
Chloride: 106 mmol/L (ref 101–111)
Creatinine, Ser: 1.8 mg/dL — ABNORMAL HIGH (ref 0.61–1.24)
GFR calc Af Amer: 45 mL/min — ABNORMAL LOW (ref >60)
GFR calc non Af Amer: 39 mL/min — ABNORMAL LOW (ref >60)
Glucose, Bld: 143 mg/dL — ABNORMAL HIGH (ref 65–99)
Potassium: 3.7 mmol/L (ref 3.5–5.1)
Sodium: 139 mmol/L (ref 135–145)

## 2015-11-07 LAB — GLUCOSE, CAPILLARY: Glucose-Capillary: 120 mg/dL — ABNORMAL HIGH (ref 65–99)

## 2015-11-07 LAB — MAGNESIUM: Magnesium: 1.7 mg/dL (ref 1.7–2.4)

## 2015-11-07 MED ORDER — VANCOMYCIN HCL 10 G IV SOLR
1250.0000 mg | INTRAVENOUS | Status: DC
  Administered 2015-11-07 – 2015-11-08 (×2): 1250 mg via INTRAVENOUS
  Filled 2015-11-07 (×2): qty 1250

## 2015-11-07 MED ORDER — PIPERACILLIN-TAZOBACTAM 3.375 G IVPB
3.3750 g | Freq: Three times a day (TID) | INTRAVENOUS | Status: DC
  Administered 2015-11-07 – 2015-11-08 (×4): 3.375 g via INTRAVENOUS
  Filled 2015-11-07 (×6): qty 50

## 2015-11-07 MED ORDER — TAMSULOSIN HCL 0.4 MG PO CAPS
0.4000 mg | ORAL_CAPSULE | Freq: Every day | ORAL | Status: DC
  Administered 2015-11-07 – 2015-11-12 (×6): 0.4 mg via ORAL
  Filled 2015-11-07 (×6): qty 1

## 2015-11-07 NOTE — Consult Note (Signed)
Reason for Consult:   Transient AVB vs blocked PACs  Requesting Physician: [facility] Primary Cardiologist Dr Katrinka Blazing saw in 2014  HPI:   Pleasant 60 y/o AA male followed at [place] in [place]. We saw him in 2014 after he presented dehydrated and had syncope with a similar rhythm then. Echo was normal. He was seen by Dr Graciela Husbands who did not recommend further Rx. The pt has no history of cardiac issues or complaints. He denies syncope, near syncope, palpitations, or chest pain. He was admitted this time with urinary retention. He has had asymptomatic traient AVB vs blocked PACs on telemery. He was unaware of these episodes.   PMHx:  Past Medical History  Diagnosis Date  . Hypertension   . Back pain   . DVT (deep venous thrombosis) (HCC)     left leg and right leg  . Knee joint pain   . Pneumonia     Past Surgical History  Procedure Laterality Date  . Mandible surgery    . Orif elbow fracture Left 09/26/2015    Procedure: OPEN REDUCTION INTERNAL FIXATION (ORIF) LEFT OLECRANON FRACTURE;  Surgeon: Tarry Kos, MD;  Location: [facility];  Service: Orthopedics;  Laterality: Left;    SOCHx:  reports that he has been smoking Cigarettes.  He has a 12 pack-year smoking history. He has never used smokeless tobacco. He reports that he drinks alcohol. He reports that he does not use illicit drugs.  FAMHx: Family History  Problem Relation Age of Onset  . Hypertension Mother   . Diabetes Father     ALLERGIES: Allergies  Allergen Reactions  . Lisinopril Swelling  . Tramadol Nausea And Vomiting    ROS: Review of Systems: General: negative for chills, fever, night sweats or weight changes.  Cardiovascular: negative for chest pain, dyspnea on exertion, edema, orthopnea, palpitations, paroxysmal nocturnal dyspnea or shortness of breath HEENT: negative for any visual disturbances, blindness, glaucoma Dermatological: negative for rash Respiratory: negative for  cough, hemoptysis, or wheezing Urologic: negative for hematuria or dysuria Abdominal: negative for nausea, vomiting, diarrhea, bright red blood per rectum, melena, or hematemesis Neurologic: negative for visual changes, syncope, or dizziness Musculoskeletal: negative for back pain, joint pain, or swelling Psych: cooperative and appropriate He says he does not snore and the he sleep well All other systems reviewed and are otherwise negative except as noted above.   HOME MEDICATIONS: Prior to Admission medications   Medication Sig Start Date End Date Taking? Authorizing Provider  albuterol (PROVENTIL HFA;VENTOLIN HFA) 108 (90 BASE) MCG/ACT inhaler Inhale 2 puffs into the lungs every 6 (six) hours as needed for wheezing or shortness of breath.    Yes Historical Provider, MD  hydrALAZINE (APRESOLINE) 10 MG tablet Take 20 mg by mouth 2 (two) times daily.    Yes Historical Provider, MD  methocarbamol (ROBAXIN) 750 MG tablet Take 1 tablet (750 mg total) by mouth 2 (two) times daily as needed for muscle spasms. 09/26/15  Yes Naiping Donnelly Stager, MD  Multiple Vitamin (MULTIVITAMIN WITH MINERALS) TABS Take 1 tablet by mouth every morning.    Yes Historical Provider, MD  ondansetron (ZOFRAN) 4 MG tablet Take 1-2 tablets (4-8 mg total) by mouth every 8 (eight) hours as needed for nausea or vomiting. 09/26/15  Yes Naiping Donnelly Stager, MD  oxyCODONE (OXYCONTIN) 10 mg 12 hr tablet Take 1 tablet (10 mg total) by mouth every 12 (twelve) hours. 09/26/15  Yes Naiping Donnelly Stager,  MD  oxyCODONE-acetaminophen (PERCOCET) 5-325 MG tablet Take 1-2 tablets by mouth every 4 (four) hours as needed for severe pain. 09/26/15  Yes Naiping Donnelly Stager, MD    HOSPITAL MEDICATIONS: I have reviewed the patient's current medications.  VITALS: Blood pressure 117/67, pulse 82, temperature 98.7 F (37.1 C), temperature source Oral, resp. rate 18, height 6' 1.5" (1.867 m), weight 242 lb (109.77 kg), SpO2 98 %.  PHYSICAL EXAM: General appearance: alert,  cooperative and no distress Neck: no carotid bruit and no JVD Lungs: clear to auscultation bilaterally Heart: regular rate and rhythm Abdomen: truncal obesity Extremities: no edema, scar Rt elbow from surgery 2 months ago Pulses: 2+ and symmetric Skin: Skin color, texture, turgor normal. No rashes or lesions Neurologic: Grossly normal  LABS: Results for orders placed or performed during the hospital encounter of 11/06/15 (from the past 24 hour(s))  Glucose, capillary     Status: Abnormal   Collection Time: 11/07/15  7:45 AM  Result Value Ref Range   Glucose-Capillary 120 (H) 65 - 99 mg/dL    EKG: NSR, 1st degree AVB  IMAGING: US Renal  11/07/2015  CLINICAL DATA:  Two day history of urinary retention.  Hypertension. EXAM: RENAL ULTRASOUND COMPARISON:  December 07, 2008 FINDINGS: Right Kidney: Length: 10.5 cm. Echogenicity and renal cortical thickness are within normal limits. No mass, perinephric fluid, or hydronephrosis visualized. There is scarring in the lower pole right kidney region. No sonographically demonstrable calculus or ureterectasis. Left Kidney: Length: 11.2 cm. Echogenicity and renal cortical thickness are within normal limits. No mass perinephric fluid, or hydronephrosis visualized. No sonographically demonstrable calculus or ureterectasis. Bladder: Decompressed with a Foley catheter and cannot be assessed. IMPRESSION: Urinary bladder is decompressed with a Foley catheter and cannot be assessed. Scarring lower pole right kidney. Kidneys bilaterally otherwise appear unremarkable. Electronically Signed   By: Bretta Bang III M.D.   On: 11/07/2015 14:50   Dg Chest Port 1 View  11/06/2015  CLINICAL DATA:  Acute onset of fever and hypoxia. Initial encounter. EXAM: PORTABLE CHEST 1 VIEW COMPARISON:  Chest radiograph performed 09/25/2014 FINDINGS: The lungs are well-aerated. Mild vascular congestion is noted. Mild bibasilar opacities may reflect atelectasis or possibly mild  infection, depending on the patient's symptoms. There is no evidence of pleural effusion or pneumothorax. The cardiomediastinal silhouette is mildly enlarged. No acute osseous abnormalities are seen. IMPRESSION: Mild vascular congestion and mild cardiomegaly. Bibasilar opacities may reflect atelectasis or possibly mild infection, depending on the patient's symptoms. Electronically Signed   By: Roanna Raider M.D.   On: 11/06/2015 04:18    IMPRESSION: Principal Problem:   Urinary retention Active Problems:   Transient complete heart block vs blocked PACs   CAP (community acquired pneumonia)   Sepsis (HCC)   Hypertension   History of DVT (deep vein thrombosis)-? 2012   Poor dentition   RECOMMENDATION: Check K+ and Mg++- observe on telemetry  Time Spent Directly with Patient: 40 minutes  Corine Shelter, [place]  [phone] beeper 11/07/2015, 6:25 PM   I have personally seen and examined this patient with Corine Shelter, PA-C. I agree with the assessment and plan as outlined above. He is admitted with urinary retention and is being treated for CAP. He is known to have PACs with 1st degree AV block. We are consulted to because of possible high grade AV block on tele. I have reviewed his telemetry and this appears to be sinus with 1st degree AV block and blocked PACs. He is asymptomatic. Will review  strips with strips with EP in am. No changes tonight.   [place] Kyler 11/07/2015 7:08 PM

## 2015-11-07 NOTE — Progress Notes (Signed)
Utilization review completed.  

## 2015-11-07 NOTE — Progress Notes (Signed)
She had a brief episode of 30 AV block noted on the monitor with heart rate dropping down to 35. Asymptomatic. As per the nursing was not asleep and was talking to  the nurse tech when this was reported. Patient denies any symptoms. Twelve-lead EKG done showed first-degree AV block. Patient had similar symptoms in 2014 when he was admitted for near syncope and had a 3rd degree  AV block noted. He was evaluated by EP and recommended that this was possibly due to sleep apnea and recommended outpatient sleep study which she never had. Cardiology consulted to evaluate further. Continue telemetry monitoring.

## 2015-11-07 NOTE — Progress Notes (Addendum)
Tele called about 3AVB HB and HR dropped to 35, Dr made aware via text.

## 2015-11-07 NOTE — Progress Notes (Signed)
[facility] PROGRESS NOTE  Malik Mullins WUJ:[id number] DOB: [birth year] DOA: 11/06/2015 PCP: PROVIDER NOT IN SYSTEM  brief narrative 60 year old male with history of hypertension, bilateral lower leg DVT, who presented to the ED initially for urinary retention which was relieved after a Foley was placed in. In the ED he was found to have fever with chills (temperature 100.3 Fahrenheit) and decided to the 70s on room air. He also complained of having cough with greenish sputum. Gestational in the ED showed bibasilar infiltrate concerning for pneumonia and was admitted to hospitalist service.  Assessment/Plan: Acute hypoxic respiratory failure with bilateral lobar pneumonia Flu PCR negative. On empiric vancomycin and Zosyn. Blood cultures on admission growing gram-positive cocci in chains in both aerobic and anaerobic bottles. (Antibiotic was narrowed to Rocephin and azithromycin and assist back to vancomycin and Zosyn). Follow sensitivity. Patient remains afebrile and denies further symptoms. Check urine drug screen.  Elevated lactic acid On admission.  resolved with IV fluids.  Acute urinary retention with UTI Cultures growing enterococcus. Follow sensitivity. Continue Foley. Check Renal ultrasound.  CKD stage 3 Renal function at baseline.follow [facility]  DVT prophylaxis: Subcutaneous heparin Diet: Regular   Code Status: Full code Family Communication: None at bedside  Disposition Plan: currently inpt   Consultants:  none  Procedures:  none  Antibiotics:  IV vanco and zosyn since 2/21  HPI/Subjective: Seen and examined. Denies any symptoms. Remains afebrile. Blood cultures on admission growing gram-positive cocci in both aerobic and anaerobic bottles.  Objective: Filed Vitals:   11/07/15 0457 11/07/15 1000  BP: 116/64 117/67  Pulse: 96 82  Temp: 99.8 F (37.7 C) 98.7 F (37.1 C)  Resp: 19 18    Intake/Output Summary (Last 24 hours) at  11/07/15 1359 Last data filed at 11/07/15 0900  Gross per 24 hour  Intake 2112.08 ml  Output   2300 ml  Net -187.92 ml   Filed Weights   11/06/15 0031 11/06/15 0446  Weight: 109.969 kg (242 lb 7 oz) 109.77 kg (242 lb)    Exam:   General: Middle aged obese male in no acute distress  HEENT: No pallor, moist mucosa  Chest: Clear bilaterally  CVS: Normal S1 and S2, no murmurs rubs or gallop  GI: Soft, nondistended, nontender  Musculoskeletal: warm, no edema  CNS: Alert and oriented    Data Reviewed: Basic Metabolic Panel:  Recent Labs Lab 11/06/15 0324  NA 139  K 4.1  CL 103  CO2 19*  GLUCOSE 118*  BUN 20  CREATININE 2.00*  CALCIUM 9.3   Liver Function Tests:  Recent Labs Lab 11/06/15 0324  AST 30  ALT 19  ALKPHOS 100  BILITOT 0.9  PROT 7.3  ALBUMIN 4.0   No results for input(s): LIPASE, AMYLASE in the last 168 hours. No results for input(s): AMMONIA in the last 168 hours. CBC:  Recent Labs Lab 11/06/15 0324  WBC 9.8  NEUTROABS 8.9*  HGB 16.0  HCT 50.3  MCV 84.5  PLT 152   Cardiac Enzymes: No results for input(s): CKTOTAL, CKMB, CKMBINDEX, TROPONINI in the last 168 hours. BNP (last 3 results)  Recent Labs  11/06/15 0324  BNP 18.8    ProBNP (last 3 results) No results for input(s): PROBNP in the last 8760 hours.  CBG:  Recent Labs Lab 11/07/15 0745  GLUCAP 120*    Recent Results (from the past 240 hour(s))  Blood Culture (routine x 2)     Status: None (Preliminary result)  Collection Time: 11/06/15  3:24 AM  Result Value Ref Range Status   Specimen Description BLOOD RIGHT ARM  Final   Special Requests BOTTLES DRAWN AEROBIC AND ANAEROBIC  Final   Culture  Setup Time   Final    GRAM POSITIVE COCCI IN CHAINS IN PAIRS IN BOTH AEROBIC AND ANAEROBIC BOTTLES CRITICAL RESULT CALLED TO, READ BACK BY AND VERIFIED WITH: Clifton James RN 2114 11/06/15 A BROWNING    Culture   Final    GRAM POSITIVE COCCI CULTURE REINCUBATED FOR  BETTER GROWTH    Report Status PENDING  Incomplete  Blood Culture (routine x 2)     Status: None (Preliminary result)   Collection Time: 11/06/15  3:40 AM  Result Value Ref Range Status   Specimen Description BLOOD RIGHT HAND  Final   Special Requests BOTTLES DRAWN AEROBIC AND ANAEROBIC  Final   Culture  Setup Time   Final    GRAM POSITIVE COCCI IN CHAINS IN PAIRS IN BOTH AEROBIC AND ANAEROBIC BOTTLES CRITICAL RESULT CALLED TO, READ BACK BY AND VERIFIED WITH: Clifton James RN 2114 11/06/15 A BROWNING    Culture   Final    GRAM POSITIVE COCCI CULTURE REINCUBATED FOR BETTER GROWTH    Report Status PENDING  Incomplete  Urine culture     Status: None (Preliminary result)   Collection Time: 11/06/15  4:07 AM  Result Value Ref Range Status   Specimen Description URINE, RANDOM  Final   Special Requests NONE  Final   Culture >=100,000 COLONIES/mL ENTEROCOCCUS SPECIES  Final   Report Status PENDING  Incomplete  Culture, sputum-assessment     Status: None   Collection Time: 11/06/15  9:25 AM  Result Value Ref Range Status   Specimen Description SPUTUM  Final   Special Requests NONE  Final   Sputum evaluation   Final    THIS SPECIMEN IS ACCEPTABLE. RESPIRATORY CULTURE REPORT TO FOLLOW.   Report Status 11/06/2015 FINAL  Final  Gram stain     Status: None   Collection Time: 11/06/15  9:26 AM  Result Value Ref Range Status   Specimen Description SPUTUM  Final   Special Requests NONE  Final   Gram Stain   Final    MODERATE WBC PRESENT, PREDOMINANTLY PMN MODERATE GRAM POSITIVE COCCI IN PAIRS FEW GRAM NEGATIVE RODS RARE GRAM POSITIVE RODS    Report Status 11/06/2015 FINAL  Final  Culture, respiratory (NON-Expectorated)     Status: None (Preliminary result)   Collection Time: 11/06/15  9:26 AM  Result Value Ref Range Status   Specimen Description SPUTUM  Final   Special Requests NONE  Final   Gram Stain   Final    MODERATE WBC PRESENT, PREDOMINANTLY PMN FEW SQUAMOUS EPITHELIAL CELLS  PRESENT MODERATE GRAM POSITIVE COCCI IN PAIRS FEW GRAM NEGATIVE RODS RARE GRAM POSITIVE RODS Performed at [facility] THIS SPECIMEN IS ACCEPTABLE FOR SPUTUM CULTURE Performed at [facility]    Culture PENDING  Incomplete   Report Status PENDING  Incomplete     Studies: Dg Chest Port 1 View  11/06/2015  CLINICAL DATA:  Acute onset of fever and hypoxia. Initial encounter. EXAM: PORTABLE CHEST 1 VIEW COMPARISON:  Chest radiograph performed 09/25/2014 FINDINGS: The lungs are well-aerated. Mild vascular congestion is noted. Mild bibasilar opacities may reflect atelectasis or possibly mild infection, depending on the patient's symptoms. There is no evidence of pleural effusion or pneumothorax. The cardiomediastinal silhouette is mildly enlarged. No acute osseous abnormalities are  seen. IMPRESSION: Mild vascular congestion and mild cardiomegaly. Bibasilar opacities may reflect atelectasis or possibly mild infection, depending on the patient's symptoms. Electronically Signed   By: Roanna Raider M.D.   On: 11/06/2015 04:18    Scheduled Meds: . heparin  5,000 Units Subcutaneous 3 times per day  . oxyCODONE  10 mg Oral Q12H  . piperacillin-tazobactam (ZOSYN)  IV  3.375 g Intravenous 3 times per day  . vancomycin  1,250 mg Intravenous Q24H   Continuous Infusions: . sodium chloride 125 mL/hr at 11/07/15 0430      Time spent:25 minutes    Sherrica Niehaus  [facility] Pager [phone]. If 7PM-7AM, please contact night-coverage at [email or web address], password [facility] 11/07/2015, 1:59 PM  LOS: 1 day

## 2015-11-07 NOTE — Progress Notes (Signed)
Patient arrived on unit via stretcher. Patient alert and oriented x4. Patient oriented to staff, room and unit. Patient placed on telemetry monitor, CCMD notified. Patient's skin assessment completed with two RNs, old abrasion on right shin noted. Patient's IV clean, dry and infusing. Patient denies pain. Safety Fall Prevention Plan was given, discussed and signed by patient. Orders have been reviewed and implemented. Call light has been placed within reach. RN will continue to monitor the patient. Rivka Barbara BSN, RN  Phone Number: [phone]

## 2015-11-07 NOTE — Progress Notes (Signed)
Pharmacy Antibiotic Note  Malik Mullins is a 60 y.o. male admitted on 11/06/2015 with sepsis.  Pharmacy has been consulted for vancomycin and zosyn dosing.  Pt received vancomycin 1g and zosyn 3.375g IV once in the ED on 2/21, then antibiotics were changed to azithromycin and ceftriaxone. Now to re-broaden with positive cultures.  Plan: Vancomycin  IV every 24 hours.  Goal trough 15-20 mcg/mL. Zosyn 3.375g IV q8h (4 hour infusion).  Monitor culture data, renal function and clinical course [facility] at [facility] prn  Height: 6' 1.5" (186.7 cm) Weight: 242 lb (109.77 kg) IBW/kg (Calculated) : 81.05  Temp (24hrs), Avg:99.1 F (37.3 C), Min:98.4 F (36.9 C), Max:99.8 F (37.7 C)   Recent Labs Lab 11/06/15 0324 11/06/15 0331 11/06/15 0559 11/06/15 0805 11/06/15 1221  WBC 9.8  --   --   --   --   CREATININE 2.00*  --   --   --   --   LATICACIDVEN  --  4.90* 2.21* 2.4* 1.7    Estimated Creatinine Clearance: 51.4 mL/min (by C-G formula based on Cr of 2).    Allergies  Allergen Reactions  . Lisinopril Swelling  . Tramadol Nausea And Vomiting    Antimicrobials this admission: Vanc 2/21 x1; 2/22>> Zosyn 2/21 x1; 2/22>> Azith 2/21>>2/22 CTX 2/21>>2/22  Dose adjustments this admission: n/a  Microbiology results: 2/21 BCx: 2/2 GPC 2/21 sputum: mod GPC, few GNR, rare GPR 2/21 urine: sent 2/21 strep pneumo: neg 2/21 legionella: ip 2/21 flu panel: neg  Malik Mullins D. Marcianne Ozbun, PharmD, BCPS Clinical Pharmacist Pager: [phone] 11/07/2015 9:18 AM

## 2015-11-08 DIAGNOSIS — K089 Disorder of teeth and supporting structures, unspecified: Secondary | ICD-10-CM

## 2015-11-08 DIAGNOSIS — B3749 Other urogenital candidiasis: Secondary | ICD-10-CM | POA: Diagnosis present

## 2015-11-08 DIAGNOSIS — N3000 Acute cystitis without hematuria: Secondary | ICD-10-CM | POA: Diagnosis present

## 2015-11-08 DIAGNOSIS — Z86718 Personal history of other venous thrombosis and embolism: Secondary | ICD-10-CM

## 2015-11-08 DIAGNOSIS — B952 Enterococcus as the cause of diseases classified elsewhere: Secondary | ICD-10-CM | POA: Diagnosis present

## 2015-11-08 DIAGNOSIS — R7881 Bacteremia: Secondary | ICD-10-CM

## 2015-11-08 DIAGNOSIS — I442 Atrioventricular block, complete: Secondary | ICD-10-CM

## 2015-11-08 DIAGNOSIS — I441 Atrioventricular block, second degree: Secondary | ICD-10-CM

## 2015-11-08 LAB — URINE CULTURE

## 2015-11-08 LAB — GLUCOSE, CAPILLARY: GLUCOSE-CAPILLARY: 109 mg/dL — AB (ref 65–99)

## 2015-11-08 MED ORDER — AMPICILLIN SODIUM 2 G IJ SOLR
2.0000 g | Freq: Four times a day (QID) | INTRAMUSCULAR | Status: DC
  Administered 2015-11-08 – 2015-11-13 (×21): 2 g via INTRAVENOUS
  Filled 2015-11-08 (×24): qty 2000

## 2015-11-08 MED ORDER — POLYETHYLENE GLYCOL 3350 17 G PO PACK
17.0000 g | PACK | Freq: Every day | ORAL | Status: DC
  Administered 2015-11-08 – 2015-11-13 (×3): 17 g via ORAL
  Filled 2015-11-08 (×5): qty 1

## 2015-11-08 NOTE — Progress Notes (Addendum)
[facility] PROGRESS NOTE  Malik Mullins ZOX:[id number] DOB: 1955-10-15 DOA: 11/06/2015 PCP: PROVIDER NOT IN SYSTEM  brief narrative 60 year old male with history of hypertension, bilateral lower leg DVT, who presented to the ED initially for urinary retention which was relieved after a Foley was placed in. In the ED he was found to have fever with chills (temperature 100.3 Fahrenheit) and decided to the 70s on room air. He also complained of having cough with greenish sputum. Gestational in the ED showed bibasilar infiltrate concerning for pneumonia and was admitted to hospitalist service.  Assessment/Plan: Acute hypoxic respiratory failure with bilateral lobar pneumonia and ?enterococcus bacteremia  On empiric vancomycin and Zosyn. Blood cultures on admission growing enterococcus in both bottles, likely urinary source . Sensitive to ampicillin and antibiotics changed. ID consulting and will follow up with recommendations. May possibly need TEE to rule out endocarditis.  Patient remains afebrile and denies further symptoms.   Elevated lactic acid On admission.  resolved with IV fluids.  Acute urinary retention with UTI Cultures growing enterococcus. Follow sensitivity. Continue Foley. Renal ultrasound does not show any hydronephrosis or current on prostate enlargement. Patient possibly has BPH as he gives history of having urinary urgency and hesitancy. I have added Flomax. He may need to go home on Foley catheter and see urologist as outpatient.  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 Of note patient was admitted 2014 with near syncope and had concern for third-degree AV block when he was seen by EP and recommended this was likely due to sleep apnea and recommended outpatient sleep study. Cardiology consulted who reviewed the telemetry strip and overnight showed some  sinus pauses on telemetry and recommends likely due to sinus arrhythmia with prolonged PR and type I second-degree AV block (Wenckebach). Patient remains asymptomatic. They have discussed with EDP and no further workup necessary.  CKD stage 3 Renal function at baseline.[facility]  Constipation Add MiraLAX.  DVT prophylaxis: Subcutaneous heparin Diet: Regular   Code Status: Full code Family Communication: None at bedside  Disposition Plan: currently inpt. Pending final sensitivity results and decision on total duration of antibiotics.   Consultants:  none  Procedures:  none  Antibiotics:  IV vanco and zosyn since 2/21--2/23  Ampicillin 2/23--  HPI/Subjective: Seen and examined. Denies any symptoms. Remains afebrile. Blood cultures on admission growing gram-positive cocci in both aerobic and anaerobic bottles.  Objective: Filed Vitals:   11/08/15 0551 11/08/15 0951  BP: 144/81 136/88  Pulse: 79 67  Temp: 98 F (36.7 C) 97.5 F (36.4 C)  Resp: 19 18    Intake/Output Summary (Last 24 hours) at 11/08/15 1144 Last data filed at 11/08/15 0900  Gross per 24 hour  Intake   1420 ml  Output   6150 ml  Net  -4730 ml   Filed Weights   11/06/15 0031 11/06/15 0446 11/08/15 0100  Weight: 109.969 kg (242 lb 7 oz) 109.77 kg (242 lb) 109.317 kg (241 lb)    Exam:   General: no acute distress  HEENT:  moist mucosa  Chest: Clear bilaterally  CVS: Normal S1 and S2, no murmurs rubs or gallop  GI: Soft, nondistended, nontender  Musculoskeletal: warm, no edema, Foley draining clear urine    Data Reviewed: Basic Metabolic Panel:  Recent Labs Lab 11/06/15 0324 11/07/15 1900  NA 139 139  K 4.1 3.7  CL 103 106  CO2 19* 21*  GLUCOSE 118* 143*  BUN 20 12  CREATININE 2.00* 1.80*  CALCIUM 9.3 8.6*  MG  --  1.7   Liver Function Tests:  Recent Labs Lab 11/06/15 0324  AST 30  ALT 19  ALKPHOS 100  BILITOT 0.9  PROT 7.3  ALBUMIN 4.0    No results for input(s): LIPASE, AMYLASE in the last 168 hours. No results for input(s): AMMONIA in the last 168 hours. CBC:  Recent Labs Lab 11/06/15 0324  WBC 9.8  NEUTROABS 8.9*  HGB 16.0  HCT 50.3  MCV 84.5  PLT 152   Cardiac Enzymes: No results for input(s): CKTOTAL, CKMB, CKMBINDEX, TROPONINI in the last 168 hours. BNP (last 3 results)  Recent Labs  11/06/15 0324  BNP 18.8    ProBNP (last 3 results) No results for input(s): PROBNP in the last 8760 hours.  CBG:  Recent Labs Lab 11/07/15 0745  GLUCAP 120*    Recent Results (from the past 240 hour(s))  Blood Culture (routine x 2)     Status: None (Preliminary result)   Collection Time: 11/06/15  3:24 AM  Result Value Ref Range Status   Specimen Description BLOOD RIGHT ARM  Final   Special Requests BOTTLES DRAWN AEROBIC AND ANAEROBIC  Final   Culture  Setup Time   Final    GRAM POSITIVE COCCI IN CHAINS IN PAIRS IN BOTH AEROBIC AND ANAEROBIC BOTTLES CRITICAL RESULT CALLED TO, READ BACK BY AND VERIFIED WITH: Clifton James RN 2114 11/06/15 A BROWNING    Culture ENTEROCOCCUS SPECIES SUSCEPTIBILITIES TO FOLLOW   Final   Report Status PENDING  Incomplete  Blood Culture (routine x 2)     Status: None (Preliminary result)   Collection Time: 11/06/15  3:40 AM  Result Value Ref Range Status   Specimen Description BLOOD RIGHT HAND  Final   Special Requests BOTTLES DRAWN AEROBIC AND ANAEROBIC  Final   Culture  Setup Time   Final    GRAM POSITIVE COCCI IN CHAINS IN PAIRS IN BOTH AEROBIC AND ANAEROBIC BOTTLES CRITICAL RESULT CALLED TO, READ BACK BY AND VERIFIED WITH: Clifton James RN 2114 11/06/15 A BROWNING    Culture ENTEROCOCCUS SPECIES SUSCEPTIBILITIES TO FOLLOW   Final   Report Status PENDING  Incomplete  Urine culture     Status: None   Collection Time: 11/06/15  4:07 AM  Result Value Ref Range Status   Specimen Description URINE, RANDOM  Final   Special Requests NONE  Final   Culture >=100,000  COLONIES/mL ENTEROCOCCUS SPECIES  Final   Report Status 11/08/2015 FINAL  Final   Organism ID, Bacteria ENTEROCOCCUS SPECIES  Final      Susceptibility   Enterococcus species - MIC*    AMPICILLIN <=2 SENSITIVE Sensitive     LEVOFLOXACIN 0.5 SENSITIVE Sensitive     NITROFURANTOIN <=16 SENSITIVE Sensitive     VANCOMYCIN 1 SENSITIVE Sensitive     * >=100,000 COLONIES/mL ENTEROCOCCUS SPECIES  Culture, sputum-assessment     Status: None   Collection Time: 11/06/15  9:25 AM  Result Value Ref Range Status   Specimen Description SPUTUM  Final   Special Requests NONE  Final   Sputum evaluation   Final    THIS SPECIMEN IS ACCEPTABLE. RESPIRATORY CULTURE REPORT TO FOLLOW.   Report Status 11/06/2015 FINAL  Final  Gram stain     Status: None   Collection Time: 11/06/15  9:26 AM  Result Value Ref Range Status   Specimen Description SPUTUM  Final   Special Requests NONE  Final   Gram Stain   Final    MODERATE WBC PRESENT, PREDOMINANTLY PMN MODERATE GRAM POSITIVE COCCI IN PAIRS FEW GRAM NEGATIVE RODS RARE GRAM POSITIVE RODS    Report Status 11/06/2015 FINAL  Final  Culture, respiratory (NON-Expectorated)     Status: None (Preliminary result)   Collection Time: 11/06/15  9:26 AM  Result Value Ref Range Status   Specimen Description SPUTUM  Final   Special Requests NONE  Final   Gram Stain   Final    MODERATE WBC PRESENT, PREDOMINANTLY PMN FEW SQUAMOUS EPITHELIAL CELLS PRESENT MODERATE GRAM POSITIVE COCCI IN PAIRS FEW GRAM NEGATIVE RODS RARE GRAM POSITIVE RODS Performed at [facility] THIS SPECIMEN IS ACCEPTABLE FOR SPUTUM CULTURE Performed at [facility]    Culture   Final    Culture reincubated for better growth Performed at [facility]    Report Status PENDING  Incomplete     Studies: US Renal  11/07/2015  CLINICAL DATA:  Two day history of urinary retention.  Hypertension. EXAM: RENAL ULTRASOUND COMPARISON:  December 07, 2008 FINDINGS: Right Kidney:  Length: 10.5 cm. Echogenicity and renal cortical thickness are within normal limits. No mass, perinephric fluid, or hydronephrosis visualized. There is scarring in the lower pole right kidney region. No sonographically demonstrable calculus or ureterectasis. Left Kidney: Length: 11.2 cm. Echogenicity and renal cortical thickness are within normal limits. No mass perinephric fluid, or hydronephrosis visualized. No sonographically demonstrable calculus or ureterectasis. Bladder: Decompressed with a Foley catheter and cannot be assessed. IMPRESSION: Urinary bladder is decompressed with a Foley catheter and cannot be assessed. Scarring lower pole right kidney. Kidneys bilaterally otherwise appear unremarkable. Electronically Signed   By: Bretta Bang III M.D.   On: 11/07/2015 14:50    Scheduled Meds: . ampicillin (OMNIPEN) IV  2 g Intravenous 4 times per day  . heparin  5,000 Units Subcutaneous 3 times per day  . oxyCODONE  10 mg Oral Q12H  . polyethylene glycol  17 g Oral Daily  . tamsulosin  0.4 mg Oral QPC supper   Continuous Infusions: . sodium chloride 75 mL/hr at 11/08/15 0716      Time spent:25 minutes    Malik Mullins  [facility] Pager [phone]. If 7PM-7AM, please contact night-coverage at [email or web address], password [facility] 11/08/2015, 11:44 AM  LOS: 2 days

## 2015-11-08 NOTE — Consult Note (Signed)
Date of Admission:  11/06/2015  Date of Consult:  11/08/2015  Reason for Consult: Enterococcal bacteremia Referring Physician: "Auto-consult", Dr. Clementeen Graham   HPI: Malik Mullins is an 60 y.o. male with PMHX significant  For DVT's, HTN, CKD presented to ED after unable to urinate at all. Foley was placed in ED and upon exam he was found to be febrile with chills and hypoxemia.  CXR showed bibasilar infiltrates. UA with pyuria. He was started on vancomycin and zosyn. Urine cultures yielded AMP S enterococcus and blood cultures have yielded 2/2 Enterococcus S pending     Past Medical History  Diagnosis Date  . Hypertension   . Back pain   . DVT (deep venous thrombosis) (HCC)     left leg and right leg  . Knee joint pain   . Pneumonia     Past Surgical History  Procedure Laterality Date  . Mandible surgery    . Orif elbow fracture Left 09/26/2015    Procedure: OPEN REDUCTION INTERNAL FIXATION (ORIF) LEFT OLECRANON FRACTURE;  Surgeon: Leandrew Koyanagi, MD;  Location: [facility];  Service: Orthopedics;  Laterality: Left;    Social History:  reports that he has been smoking Cigarettes.  He has a 12 pack-year smoking history. He has never used smokeless tobacco. He reports that he drinks alcohol. He reports that he does not use illicit drugs.   Family History  Problem Relation Age of Onset  . Hypertension Mother   . Diabetes Father     Allergies  Allergen Reactions  . Lisinopril Swelling  . Tramadol Nausea And Vomiting     Medications: I have reviewed patients current medications as documented in Epic Anti-infectives    Start     Dose/Rate Route Frequency Ordered Stop   11/08/15 1200  ampicillin (OMNIPEN) 2 g in sodium chloride 0.9 % 50 mL IVPB     2 g 150 mL/hr over 20 Minutes Intravenous 4 times per day 11/08/15 1130     11/07/15 1000  vancomycin (VANCOCIN) 1,250 mg in sodium chloride 0.9 % 250 mL IVPB  Status:  Discontinued     1,250 mg 166.7  mL/hr over 90 Minutes Intravenous Every 24 hours 11/07/15 0919 11/08/15 1128   11/07/15 0930  piperacillin-tazobactam (ZOSYN) IVPB 3.375 g  Status:  Discontinued     3.375 g 12.5 mL/hr over 240 Minutes Intravenous 3 times per day 11/07/15 0919 11/08/15 1128   11/06/15 0545  cefTRIAXone (ROCEPHIN) 1 g in dextrose 5 % 50 mL IVPB  Status:  Discontinued     1 g 100 mL/hr over 30 Minutes Intravenous Every 24 hours 11/06/15 0535 11/07/15 0912   11/06/15 0545  azithromycin (ZITHROMAX) 500 mg in dextrose 5 % 250 mL IVPB  Status:  Discontinued     500 mg 250 mL/hr over 60 Minutes Intravenous Every 24 hours 11/06/15 0535 11/07/15 0912   11/06/15 0315  piperacillin-tazobactam (ZOSYN) IVPB 3.375 g     3.375 g 100 mL/hr over 30 Minutes Intravenous  Once 11/06/15 0301 11/06/15 0552   11/06/15 0315  vancomycin (VANCOCIN) IVPB 1000 mg/200 mL premix  Status:  Discontinued     1,000 mg 200 mL/hr over 60 Minutes Intravenous  Once 11/06/15 0301 11/06/15 0309   11/06/15 0315  vancomycin (VANCOCIN) 1,750 mg in sodium chloride 0.9 % 500 mL IVPB     1,750 mg 250 mL/hr over 120 Minutes Intravenous  Once 11/06/15 0309 11/06/15 0631  ROS: as in HPI otherwise remainder of 12 point Review of Systems is negative   Blood pressure 130/78, pulse 72, temperature 98 F (36.7 C), temperature source Oral, resp. rate 18, height 6' 1.5" (1.867 m), weight 241 lb (109.317 kg), SpO2 98 %. General: Alert and awake, oriented x3, not in any acute distress. HEENT: anicteric sclera,  EOMI, oropharynx clear and without exudate Cardiovascular: egular rate, normal r,  no murmur rubs or gallops Pulmonary: clear to auscultation bilaterally, no wheezing, rales or rhonchi Gastrointestinal: soft nontender, nondistended, normal bowel sounds, Musculoskeletal: no  clubbing or edema noted bilaterally Skin, soft tissue: no rashes Neuro: nonfocal, strength and sensation intact   Results for orders placed or performed during the  hospital encounter of 11/06/15 (from the past 48 hour(s))  Glucose, capillary     Status: Abnormal   Collection Time: 11/07/15  7:45 AM  Result Value Ref Range   Glucose-Capillary 120 (H) 65 - 99 mg/dL  Basic metabolic panel     Status: Abnormal   Collection Time: 11/07/15  7:00 PM  Result Value Ref Range   Sodium 139 135 - 145 mmol/L   Potassium 3.7 3.5 - 5.1 mmol/L   Chloride 106 101 - 111 mmol/L   CO2 21 (L) 22 - 32 mmol/L   Glucose, Bld 143 (H) 65 - 99 mg/dL   BUN 12 6 - 20 mg/dL   Creatinine, Ser 1.80 (H) 0.61 - 1.24 mg/dL   Calcium 8.6 (L) 8.9 - 10.3 mg/dL   GFR calc non Af Amer 39 (L) >60 mL/min   GFR calc Af Amer 45 (L) >60 mL/min    Comment: (NOTE) The eGFR has been calculated using the CKD EPI equation. This calculation has not been validated in all clinical situations. eGFR's persistently <60 mL/min signify possible Chronic Kidney Disease.    Anion gap 12 5 - 15  Magnesium     Status: None   Collection Time: 11/07/15  7:00 PM  Result Value Ref Range   Magnesium 1.7 1.7 - 2.4 mg/dL    ) Recent Results (from the past 720 hour(s))  Blood Culture (routine x 2)     Status: None (Preliminary result)   Collection Time: 11/06/15  3:24 AM  Result Value Ref Range Status   Specimen Description BLOOD RIGHT ARM  Final   Special Requests BOTTLES DRAWN AEROBIC AND ANAEROBIC 5ML  Final   Culture  Setup Time   Final    GRAM POSITIVE COCCI IN CHAINS IN PAIRS IN BOTH AEROBIC AND ANAEROBIC BOTTLES CRITICAL RESULT CALLED TO, READ BACK BY AND VERIFIED WITH: Van Clines RN 2114 11/06/15 A BROWNING    Culture ENTEROCOCCUS SPECIES SUSCEPTIBILITIES TO FOLLOW   Final   Report Status PENDING  Incomplete  Blood Culture (routine x 2)     Status: None (Preliminary result)   Collection Time: 11/06/15  3:40 AM  Result Value Ref Range Status   Specimen Description BLOOD RIGHT HAND  Final   Special Requests BOTTLES DRAWN AEROBIC AND ANAEROBIC 5ML  Final   Culture  Setup Time   Final     GRAM POSITIVE COCCI IN CHAINS IN PAIRS IN BOTH AEROBIC AND ANAEROBIC BOTTLES CRITICAL RESULT CALLED TO, READ BACK BY AND VERIFIED WITH: Van Clines RN 2114 11/06/15 A BROWNING    Culture ENTEROCOCCUS SPECIES SUSCEPTIBILITIES TO FOLLOW   Final   Report Status PENDING  Incomplete  Urine culture     Status: None   Collection Time: 11/06/15  4:07 AM  Result Value Ref Range Status   Specimen Description URINE, RANDOM  Final   Special Requests NONE  Final   Culture >=100,000 COLONIES/mL ENTEROCOCCUS SPECIES  Final   Report Status 11/08/2015 FINAL  Final   Organism ID, Bacteria ENTEROCOCCUS SPECIES  Final      Susceptibility   Enterococcus species - MIC*    AMPICILLIN <=2 SENSITIVE Sensitive     LEVOFLOXACIN 0.5 SENSITIVE Sensitive     NITROFURANTOIN <=16 SENSITIVE Sensitive     VANCOMYCIN 1 SENSITIVE Sensitive     * >=100,000 COLONIES/mL ENTEROCOCCUS SPECIES  Culture, sputum-assessment     Status: None   Collection Time: 11/06/15  9:25 AM  Result Value Ref Range Status   Specimen Description SPUTUM  Final   Special Requests NONE  Final   Sputum evaluation   Final    THIS SPECIMEN IS ACCEPTABLE. RESPIRATORY CULTURE REPORT TO FOLLOW.   Report Status 11/06/2015 FINAL  Final  Gram stain     Status: None   Collection Time: 11/06/15  9:26 AM  Result Value Ref Range Status   Specimen Description SPUTUM  Final   Special Requests NONE  Final   Gram Stain   Final    MODERATE WBC PRESENT, PREDOMINANTLY PMN MODERATE GRAM POSITIVE COCCI IN PAIRS FEW GRAM NEGATIVE RODS RARE GRAM POSITIVE RODS    Report Status 11/06/2015 FINAL  Final  Culture, respiratory (NON-Expectorated)     Status: None (Preliminary result)   Collection Time: 11/06/15  9:26 AM  Result Value Ref Range Status   Specimen Description SPUTUM  Final   Special Requests NONE  Final   Gram Stain   Final    MODERATE WBC PRESENT, PREDOMINANTLY PMN FEW SQUAMOUS EPITHELIAL CELLS PRESENT MODERATE GRAM POSITIVE COCCI IN PAIRS FEW  GRAM NEGATIVE RODS RARE GRAM POSITIVE RODS Performed at [place] Performed at [facility]    Culture   Final    Culture reincubated for better growth Performed at [facility]    Report Status PENDING  Incomplete     Impression/Recommendation  Principal Problem:   Urinary retention Active Problems:   Hypertension   History of DVT (deep vein thrombosis)-? 2012   CAP (community acquired pneumonia)   Transient complete heart block vs blocked PACs   Sepsis ([facility])   Poor dentition   Mobitz (type) I (Wenckebach's) atrioventricular block   Malik Mullins is a 60 y.o. male with  Enterococcal bacteremia likely from urinary source also with CKD and acute urinary retention  #1 Enterococcal bacteremia likely from urinary source:  --narrow to AMP --repeat blood cultures --check TTE and if negative for vegeations would get TEE to evaluate for endocarditis --if no endocarditis would treat with AMP alone for 2 weeks if endocarditis weigh AMP + gent vs AMP + high dose Ceftriaxone  #2 Screening: screen for  HCV   #3 Hypoxemia: ? Due to pulmonary edema, doubt pneumonia  I spent greater than 60 minutes with the patient including greater than 50% of time in face to face counsel of the patient re his enterococcal bacteremia, urinary retention and and in coordination of his care.     11/08/2015, 7:13 PM   Thank you so much for this interesting consult  [place] [facility] [place] [phone] (pager) [phone] (office) 11/08/2015, 7:13 PM  [place] 11/08/2015, 7:13 PM

## 2015-11-08 NOTE — Progress Notes (Signed)
     SUBJECTIVE: No complaints this am.    Tele: sinus Review overnight shows pauses which are most likely sinus arhythmia with prolonged PR with Type 1 Second degree AV block (Wenckebach)   BP 144/81 mmHg  Pulse 79  Temp(Src) 98 F (36.7 C) (Oral)  Resp 19  Ht 6' 1.5" (1.867 m)  Wt 241 lb (109.317 kg)  BMI 31.36 kg/m2  SpO2 98%  Intake/Output Summary (Last 24 hours) at 11/08/15 1610 Last data filed at 11/08/15 0600  Gross per 24 hour  Intake   1300 ml  Output   6600 ml  Net  -5300 ml    PHYSICAL EXAM General: Well developed, well nourished, in no acute distress. Alert and oriented x 3.  Psych:  Good affect, responds appropriately Neck: No JVD. No masses noted.  Lungs: Clear bilaterally with no wheezes or rhonci noted.  Heart: RRR with no murmurs noted. Abdomen: Bowel sounds are present. Soft, non-tender.  Extremities: No lower extremity edema.   LABS: Basic Metabolic Panel:  Recent Labs  96/04/54 0324 11/07/15 1900  NA 139 139  K 4.1 3.7  CL 103 106  CO2 19* 21*  GLUCOSE 118* 143*  BUN 20 12  CREATININE 2.00* 1.80*  CALCIUM 9.3 8.6*  MG  --  1.7   CBC:  Recent Labs  11/06/15 0324  WBC 9.8  NEUTROABS 8.9*  HGB 16.0  HCT 50.3  MCV 84.5  PLT 152   Current Meds: . heparin  5,000 Units Subcutaneous 3 times per day  . oxyCODONE  10 mg Oral Q12H  . piperacillin-tazobactam (ZOSYN)  IV  3.375 g Intravenous 3 times per day  . tamsulosin  0.4 mg Oral QPC supper  . vancomycin  1,250 mg Intravenous Q24H     ASSESSMENT AND PLAN:  1. Transient heart block:  I have reviewed his tele over last 24 hours. His rhythm is likely sinus arhythmia with prolonged PR with Type 1 Second degree AV block (Wenckebach). I have reviewed his strips with our EP team. No further workup is necessary. He is asymptomatic.   Will sign off. Please call with questions.   Bernardina Cacho  2/23/20177:52 AM

## 2015-11-09 ENCOUNTER — Inpatient Hospital Stay (HOSPITAL_COMMUNITY): Payer: Medicare Other

## 2015-11-09 DIAGNOSIS — R339 Retention of urine, unspecified: Secondary | ICD-10-CM

## 2015-11-09 DIAGNOSIS — N390 Urinary tract infection, site not specified: Secondary | ICD-10-CM

## 2015-11-09 DIAGNOSIS — R509 Fever, unspecified: Secondary | ICD-10-CM

## 2015-11-09 LAB — CULTURE, BLOOD (ROUTINE X 2)

## 2015-11-09 LAB — CULTURE, RESPIRATORY: CULTURE: NORMAL

## 2015-11-09 LAB — BASIC METABOLIC PANEL
Anion gap: 7 (ref 5–15)
BUN: 12 mg/dL (ref 6–20)
CALCIUM: 9.1 mg/dL (ref 8.9–10.3)
CHLORIDE: 109 mmol/L (ref 101–111)
CO2: 25 mmol/L (ref 22–32)
CREATININE: 1.67 mg/dL — AB (ref 0.61–1.24)
GFR calc non Af Amer: 43 mL/min — ABNORMAL LOW (ref >60)
GFR, EST AFRICAN AMERICAN: 50 mL/min — AB (ref >60)
Glucose, Bld: 103 mg/dL — ABNORMAL HIGH (ref 65–99)
Potassium: 4.1 mmol/L (ref 3.5–5.1)
SODIUM: 141 mmol/L (ref 135–145)

## 2015-11-09 LAB — CULTURE, RESPIRATORY W GRAM STAIN

## 2015-11-09 NOTE — Progress Notes (Signed)
[facility] PROGRESS NOTE  BAYLEY HURN ZOX:[id number] DOB: 04-17-1956 DOA: 11/06/2015 PCP: PROVIDER NOT IN SYSTEM  brief narrative 60 year old male with history of hypertension, bilateral lower leg DVT, who presented to the ED initially for urinary retention which was relieved after a Foley was placed in. In the ED he was found to have fever with chills (temperature 100.3 Fahrenheit) and decided to the 70s on room air. He also complained of having cough with greenish sputum. Gestational in the ED showed bibasilar infiltrate concerning for pneumonia and was admitted to hospitalist service.  Assessment/Plan: Acute hypoxic respiratory failure with bilateral lobar pneumonia with enterococcus bacteremia Blood cultures on admission growing enterococcus in both bottles, likely from urinary source . Sensitive to ampicillin and antibiotics changed.  ID recommends 2-D echo to see for vegetation and if negative may need TEE  Patient remains afebrile and denies further symptoms. -Repeat blood cultures sent   Elevated lactic acid On admission.  resolved with IV fluids.  Acute urinary retention with UTI Cultures growing enterococcus. Sensitive to ampicillin.. Continue Foley. Renal ultrasound does not show any hydronephrosis or current on prostate enlargement. Patient possibly has BPH as he gives history of having urinary urgency and hesitancy. I have added Flomax.  Long-term voiding trial in the morning, a failed would need Foley placed back in with outpatient urology follow-up.  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 Of note patient was admitted 2014 with near syncope and had concern for third-degree AV block when he was seen by EP and recommended this was likely due to sleep apnea and recommended outpatient sleep study. Cardiology consulted who reviewed the telemetry strip and  overnight showed some sinus pauses on telemetry and recommends likely due to sinus arrhythmia with prolonged PR and type I second-degree AV block (Wenckebach). Patient remains asymptomatic. They have discussed with EDP and no further workup necessary.  CKD stage 3 Renal function at baseline. [facility]  Constipation Improved with MiraLAX.  DVT prophylaxis: Subcutaneous heparin Diet: Regular   Code Status: Full code Family Communication: Wife at bedside  Disposition Plan: currently inpt. Awaiting 2-D echo results. ( if shows vegetations then will follow repeat blood cx for clearing and place PICC for home IV abx. If 2d echo negative will need TEE , early next week)   Consultants:  none  Procedures:  2d echo  Renal US  Antibiotics:  IV vanco and zosyn since 2/21--2/23  Ampicillin 2/23--  HPI/Subjective: Seen and examined. No Overnight issues.   Objective: Filed Vitals:   11/08/15 2001 11/09/15 0608  BP: 131/66 121/62  Pulse: 99 71  Temp: 98.2 F (36.8 C) 98 F (36.7 C)  Resp: 18 18    Intake/Output Summary (Last 24 hours) at 11/09/15 1023 Last data filed at 11/09/15 8119  Gross per 24 hour  Intake    392 ml  Output   5226 ml  Net  -4834 ml   Filed Weights   11/06/15 0031 11/06/15 0446 11/08/15 0100  Weight: 109.969 kg (242 lb 7 oz) 109.77 kg (242 lb) 109.317 kg (241 lb)    Exam:   General: no acute distress  HEENT:  moist mucosa  Chest: Clear bilaterally  CVS: Normal S1 and S2, no murmurs rubs or gallop  GI: Soft, nondistended, nontender  Musculoskeletal: warm, no edema, Foley draining clear urine    Data Reviewed: Basic Metabolic Panel:  Recent Labs Lab 11/06/15 0324 11/07/15 1900 11/09/15 0535  NA 139 139 141  K 4.1 3.7 4.1  CL 103 106 109  CO2 19* 21* 25  GLUCOSE 118* 143* 103*  BUN CREATININE 2.00* 1.80* 1.67*  CALCIUM 9.3 8.6* 9.1  MG  --  1.7  --    Liver Function Tests:  Recent  Labs Lab 11/06/15 0324  AST 30  ALT 19  ALKPHOS 100  BILITOT 0.9  PROT 7.3  ALBUMIN 4.0   No results for input(s): LIPASE, AMYLASE in the last 168 hours. No results for input(s): AMMONIA in the last 168 hours. CBC:  Recent Labs Lab 11/06/15 0324  WBC 9.8  NEUTROABS 8.9*  HGB 16.0  HCT 50.3  MCV 84.5  PLT 152   Cardiac Enzymes: No results for input(s): CKTOTAL, CKMB, CKMBINDEX, TROPONINI in the last 168 hours. BNP (last 3 results)  Recent Labs  11/06/15 0324  BNP 18.8    ProBNP (last 3 results) No results for input(s): PROBNP in the last 8760 hours.  CBG:  Recent Labs Lab 11/07/15 0745 11/08/15 2306  GLUCAP 120* 109*    Recent Results (from the past 240 hour(s))  Blood Culture (routine x 2)     Status: None   Collection Time: 11/06/15  3:24 AM  Result Value Ref Range Status   Specimen Description BLOOD RIGHT ARM  Final   Special Requests BOTTLES DRAWN AEROBIC AND ANAEROBIC  Final   Culture  Setup Time   Final    GRAM POSITIVE COCCI IN CHAINS IN PAIRS IN BOTH AEROBIC AND ANAEROBIC BOTTLES CRITICAL RESULT CALLED TO, READ BACK BY AND VERIFIED WITH: Clifton James RN 2114 11/06/15 A BROWNING    Culture ENTEROCOCCUS SPECIES  Final   Report Status 11/09/2015 FINAL  Final   Organism ID, Bacteria ENTEROCOCCUS SPECIES  Final      Susceptibility   Enterococcus species - MIC*    AMPICILLIN <=2 SENSITIVE Sensitive     VANCOMYCIN 1 SENSITIVE Sensitive     GENTAMICIN SYNERGY SENSITIVE Sensitive     * ENTEROCOCCUS SPECIES  Blood Culture (routine x 2)     Status: None   Collection Time: 11/06/15  3:40 AM  Result Value Ref Range Status   Specimen Description BLOOD RIGHT HAND  Final   Special Requests BOTTLES DRAWN AEROBIC AND ANAEROBIC  Final   Culture  Setup Time   Final    GRAM POSITIVE COCCI IN CHAINS IN PAIRS IN BOTH AEROBIC AND ANAEROBIC BOTTLES CRITICAL RESULT CALLED TO, READ BACK BY AND VERIFIED WITH: Clifton James RN 2114 11/06/15 A BROWNING    Culture    Final    ENTEROCOCCUS SPECIES SUSCEPTIBILITIES PERFORMED ON PREVIOUS CULTURE WITHIN THE LAST 5 DAYS.    Report Status 11/09/2015 FINAL  Final  Urine culture     Status: None   Collection Time: 11/06/15  4:07 AM  Result Value Ref Range Status   Specimen Description URINE, RANDOM  Final   Special Requests NONE  Final   Culture >=100,000 COLONIES/mL ENTEROCOCCUS SPECIES  Final   Report Status 11/08/2015 FINAL  Final   Organism ID, Bacteria ENTEROCOCCUS SPECIES  Final      Susceptibility   Enterococcus species - MIC*    AMPICILLIN <=2 SENSITIVE Sensitive     LEVOFLOXACIN 0.5 SENSITIVE Sensitive     NITROFURANTOIN <=16 SENSITIVE Sensitive     VANCOMYCIN 1 SENSITIVE Sensitive     * >=100,000 COLONIES/mL ENTEROCOCCUS SPECIES  Culture, sputum-assessment     Status: None   Collection Time: 11/06/15  9:25 AM  Result Value Ref Range Status   Specimen Description SPUTUM  Final   Special Requests NONE  Final   Sputum evaluation   Final    THIS SPECIMEN IS ACCEPTABLE. RESPIRATORY CULTURE REPORT TO FOLLOW.   Report Status 11/06/2015 FINAL  Final  Gram stain     Status: None   Collection Time: 11/06/15  9:26 AM  Result Value Ref Range Status   Specimen Description SPUTUM  Final   Special Requests NONE  Final   Gram Stain   Final    MODERATE WBC PRESENT, PREDOMINANTLY PMN MODERATE GRAM POSITIVE COCCI IN PAIRS FEW GRAM NEGATIVE RODS RARE GRAM POSITIVE RODS    Report Status 11/06/2015 FINAL  Final  Culture, respiratory (NON-Expectorated)     Status: None   Collection Time: 11/06/15  9:26 AM  Result Value Ref Range Status   Specimen Description SPUTUM  Final   Special Requests NONE  Final   Gram Stain   Final    MODERATE WBC PRESENT, PREDOMINANTLY PMN FEW SQUAMOUS EPITHELIAL CELLS PRESENT MODERATE GRAM POSITIVE COCCI IN PAIRS FEW GRAM NEGATIVE RODS RARE GRAM POSITIVE RODS Performed at [facility] THIS SPECIMEN IS ACCEPTABLE FOR SPUTUM CULTURE Performed at [facility]    Culture   Final    NORMAL OROPHARYNGEAL FLORA Performed at [facility]    Report Status 11/09/2015 FINAL  Final     Studies: US Renal  11/07/2015  CLINICAL DATA:  Two day history of urinary retention.  Hypertension. EXAM: RENAL ULTRASOUND COMPARISON:  December 07, 2008 FINDINGS: Right Kidney: Length: 10.5 cm. Echogenicity and renal cortical thickness are within normal limits. No mass, perinephric fluid, or hydronephrosis visualized. There is scarring in the lower pole right kidney region. No sonographically demonstrable calculus or ureterectasis. Left Kidney: Length: 11.2 cm. Echogenicity and renal cortical thickness are within normal limits. No mass perinephric fluid, or hydronephrosis visualized. No sonographically demonstrable calculus or ureterectasis. Bladder: Decompressed with a Foley catheter and cannot be assessed. IMPRESSION: Urinary bladder is decompressed with a Foley catheter and cannot be assessed. Scarring lower pole right kidney. Kidneys bilaterally otherwise appear unremarkable. Electronically Signed   By: Bretta Bang III M.D.   On: 11/07/2015 14:50    Scheduled Meds: . ampicillin (OMNIPEN) IV  2 g Intravenous 4 times per day  . heparin  5,000 Units Subcutaneous 3 times per day  . oxyCODONE  10 mg Oral Q12H  . polyethylene glycol  17 g Oral Daily  . tamsulosin  0.4 mg Oral QPC supper   Continuous Infusions: . sodium chloride 75 mL/hr at 11/08/15 2312      Time spent:25 minutes    Walker Paddack  [facility] Pager [phone]. If 7PM-7AM, please contact night-coverage at [email or web address], password [facility] 11/09/2015, 10:23 AM  LOS: 3 days

## 2015-11-09 NOTE — Progress Notes (Signed)
Echocardiogram 2D Echocardiogram has been performed.  11/09/2015 4:04 PM Gertie Fey, RVT, RDCS, RDMS

## 2015-11-09 NOTE — Care Management Important Message (Signed)
Important Message  Patient Details  Name: Malik Mullins MRN: [id number] Date of Birth: [birth year]   Medicare Important Message Given:  Yes    [name] [name] 11/09/2015, 1:12 PM

## 2015-11-09 NOTE — Care Management Note (Signed)
Case Management Note  Patient Details  Name: Malik Mullins MRN: [id number] Date of Birth: 1956/07/21  Subjective/Objective:     CM following for progression and d/c planning.               Action/Plan: 11/09/2015 Met with pt and significant other, [name] , [address] [place], [place] [address] ph [phone]  Pt will d/c to her home and she will assist with home IV antibiotics. Pt selected [facility] for [name] services, both pt and [name] are able to learn to administer IV antibiotics.  [place] notified of plan for IV antibiotics and plan to d/c this weekend. PICC explained ot pt and [name].  Await prescriptions and instructions re adminstration.   Expected Discharge Date:                  Expected Discharge Plan:  [place]  In-House Referral:  NA  Discharge planning Services  CM Consult  Post Acute Care Choice:  Durable Medical Equipment Choice offered to:  Patient  DME Arranged:  IV pump/equipment DME Agency:  [place]:  RN [facility] Agency:  [place]  Status of Service:  In process, will continue to follow  Medicare Important Message Given:    Date Medicare IM Given:    Medicare IM give by:    Date Additional Medicare IM Given:    Additional Medicare Important Message give by:     If discussed at [place] of Stay Meetings, dates discussed:    Additional Comments:  Adron Bene, RN 11/09/2015, 10:24 AM

## 2015-11-09 NOTE — Progress Notes (Signed)
Subjective: No new complaints   Antibiotics:  Anti-infectives    Start     Dose/Rate Route Frequency Ordered Stop   11/08/15 1200  ampicillin (OMNIPEN) 2 g in sodium chloride 0.9 % 50 mL IVPB     2 g 150 mL/hr over 20 Minutes Intravenous 4 times per day 11/08/15 1130     11/07/15 1000  vancomycin (VANCOCIN) 1,250 mg in sodium chloride 0.9 % 250 mL IVPB  Status:  Discontinued     1,250 mg 166.7 mL/hr over 90 Minutes Intravenous Every 24 hours 11/07/15 0919 11/08/15 1128   11/07/15 0930  piperacillin-tazobactam (ZOSYN) IVPB 3.375 g  Status:  Discontinued     3.375 g 12.5 mL/hr over 240 Minutes Intravenous 3 times per day 11/07/15 0919 11/08/15 1128   11/06/15 0545  cefTRIAXone (ROCEPHIN) 1 g in dextrose 5 % 50 mL IVPB  Status:  Discontinued     1 g 100 mL/hr over 30 Minutes Intravenous Every 24 hours 11/06/15 0535 11/07/15 0912   11/06/15 0545  azithromycin (ZITHROMAX) 500 mg in dextrose 5 % 250 mL IVPB  Status:  Discontinued     500 mg 250 mL/hr over 60 Minutes Intravenous Every 24 hours 11/06/15 0535 11/07/15 0912   11/06/15 0315  piperacillin-tazobactam (ZOSYN) IVPB 3.375 g     3.375 g 100 mL/hr over 30 Minutes Intravenous  Once 11/06/15 0301 11/06/15 0552   11/06/15 0315  vancomycin (VANCOCIN) IVPB 1000 mg/200 mL premix  Status:  Discontinued     1,000 mg 200 mL/hr over 60 Minutes Intravenous  Once 11/06/15 0301 11/06/15 0309   11/06/15 0315  vancomycin (VANCOCIN) 1,750 mg in sodium chloride 0.9 % 500 mL IVPB     1,750 mg 250 mL/hr over 120 Minutes Intravenous  Once 11/06/15 0309 11/06/15 0631      Medications: Scheduled Meds: . ampicillin (OMNIPEN) IV  2 g Intravenous 4 times per day  . heparin  5,000 Units Subcutaneous 3 times per day  . oxyCODONE  10 mg Oral Q12H  . polyethylene glycol  17 g Oral Daily  . tamsulosin  0.4 mg Oral QPC supper   Continuous Infusions: . sodium chloride 75 mL/hr at 11/08/15 2312   PRN Meds:.acetaminophen, albuterol,  methocarbamol, ondansetron, oxyCODONE-acetaminophen    Objective: Weight change:   Intake/Output Summary (Last 24 hours) at 11/09/15 2000 Last data filed at 11/09/15 1300  Gross per 24 hour  Intake    992 ml  Output   4776 ml  Net  -3784 ml   Blood pressure 136/86, pulse 75, temperature 98.4 F (36.9 C), temperature source Oral, resp. rate 18, height 6' 1.5" (1.867 m), weight 241 lb (109.317 kg), SpO2 97 %. Temp:  [97.8 F (36.6 C)-98.4 F (36.9 C)] 98.4 F (36.9 C) (02/24 1722) Pulse Rate:  [71-99] 75 (02/24 1722) Resp:  [18] 18 (02/24 1722) BP: (119-136)/(62-86) 136/86 mmHg (02/24 1722) SpO2:  [97 %-99 %] 97 % (02/24 1722)  Physical Exam: General: Alert and awake, oriented x3, not in any acute distress. HEENT: anicteric sclera, EOMI, oropharynx clear and without exudate Cardiovascular: egular rate, normal r, no murmur rubs or gallops Pulmonary: clear to auscultation bilaterally, no wheezing, rales or rhonchi Gastrointestinal: soft nontender, nondistended, normal bowel sounds, Musculoskeletal: no clubbing or edema noted bilaterally Skin, soft tissue: no rashes Neuro: nonfocal  CBC:  CBC Latest Ref Rng 11/06/2015 09/26/2015 09/25/2014  WBC 4.0 - 10.5 K/uL 9.8 - 5.2  Hemoglobin 13.0 - 17.0 g/dL  16.0 16.7 15.2  Hematocrit 39.0 - 52.0 % 50.3 49.0 46.9  Platelets 150 - 400 K/uL 152 - 164       BMET  Recent Labs  11/07/15 1900 11/09/15 0535  NA 139 141  K 3.7 4.1  CL 106 109  CO2 21* 25  GLUCOSE 143* 103*  BUN 12 12  CREATININE 1.80* 1.67*  CALCIUM 8.6* 9.1     Liver Panel  No results for input(s): PROT, ALBUMIN, AST, ALT, ALKPHOS, BILITOT, BILIDIR, IBILI in the last 72 hours.     Sedimentation Rate No results for input(s): ESRSEDRATE in the last 72 hours. C-Reactive Protein No results for input(s): CRP in the last 72 hours.  Micro Results: Recent Results (from the past 720 hour(s))  Blood Culture (routine x 2)     Status: None   Collection  Time: 11/06/15  3:24 AM  Result Value Ref Range Status   Specimen Description BLOOD RIGHT ARM  Final   Special Requests BOTTLES DRAWN AEROBIC AND ANAEROBIC  Final   Culture  Setup Time   Final    GRAM POSITIVE COCCI IN CHAINS IN PAIRS IN BOTH AEROBIC AND ANAEROBIC BOTTLES CRITICAL RESULT CALLED TO, READ BACK BY AND VERIFIED WITH: Clifton James RN 2114 11/06/15 A BROWNING    Culture ENTEROCOCCUS SPECIES  Final   Report Status 11/09/2015 FINAL  Final   Organism ID, Bacteria ENTEROCOCCUS SPECIES  Final      Susceptibility   Enterococcus species - MIC*    AMPICILLIN <=2 SENSITIVE Sensitive     VANCOMYCIN 1 SENSITIVE Sensitive     GENTAMICIN SYNERGY SENSITIVE Sensitive     * ENTEROCOCCUS SPECIES  Blood Culture (routine x 2)     Status: None   Collection Time: 11/06/15  3:40 AM  Result Value Ref Range Status   Specimen Description BLOOD RIGHT HAND  Final   Special Requests BOTTLES DRAWN AEROBIC AND ANAEROBIC  Final   Culture  Setup Time   Final    GRAM POSITIVE COCCI IN CHAINS IN PAIRS IN BOTH AEROBIC AND ANAEROBIC BOTTLES CRITICAL RESULT CALLED TO, READ BACK BY AND VERIFIED WITH: Clifton James RN 2114 11/06/15 A BROWNING    Culture   Final    ENTEROCOCCUS SPECIES SUSCEPTIBILITIES PERFORMED ON PREVIOUS CULTURE WITHIN THE LAST 5 DAYS.    Report Status 11/09/2015 FINAL  Final  Urine culture     Status: None   Collection Time: 11/06/15  4:07 AM  Result Value Ref Range Status   Specimen Description URINE, RANDOM  Final   Special Requests NONE  Final   Culture >=100,000 COLONIES/mL ENTEROCOCCUS SPECIES  Final   Report Status 11/08/2015 FINAL  Final   Organism ID, Bacteria ENTEROCOCCUS SPECIES  Final      Susceptibility   Enterococcus species - MIC*    AMPICILLIN <=2 SENSITIVE Sensitive     LEVOFLOXACIN 0.5 SENSITIVE Sensitive     NITROFURANTOIN <=16 SENSITIVE Sensitive     VANCOMYCIN 1 SENSITIVE Sensitive     * >=100,000 COLONIES/mL ENTEROCOCCUS SPECIES  Culture,  sputum-assessment     Status: None   Collection Time: 11/06/15  9:25 AM  Result Value Ref Range Status   Specimen Description SPUTUM  Final   Special Requests NONE  Final   Sputum evaluation   Final    THIS SPECIMEN IS ACCEPTABLE. RESPIRATORY CULTURE REPORT TO FOLLOW.   Report Status 11/06/2015 FINAL  Final  Gram stain     Status: None   Collection  Time: 11/06/15  9:26 AM  Result Value Ref Range Status   Specimen Description SPUTUM  Final   Special Requests NONE  Final   Gram Stain   Final    MODERATE WBC PRESENT, PREDOMINANTLY PMN MODERATE GRAM POSITIVE COCCI IN PAIRS FEW GRAM NEGATIVE RODS RARE GRAM POSITIVE RODS    Report Status 11/06/2015 FINAL  Final  Culture, respiratory (NON-Expectorated)     Status: None   Collection Time: 11/06/15  9:26 AM  Result Value Ref Range Status   Specimen Description SPUTUM  Final   Special Requests NONE  Final   Gram Stain   Final    MODERATE WBC PRESENT, PREDOMINANTLY PMN FEW SQUAMOUS EPITHELIAL CELLS PRESENT MODERATE GRAM POSITIVE COCCI IN PAIRS FEW GRAM NEGATIVE RODS RARE GRAM POSITIVE RODS Performed at [facility] THIS SPECIMEN IS ACCEPTABLE FOR SPUTUM CULTURE Performed at [facility]    Culture   Final    NORMAL OROPHARYNGEAL FLORA Performed at [facility]    Report Status 11/09/2015 FINAL  Final  Culture, blood (Routine X 2) w Reflex to ID Panel     Status: None (Preliminary result)   Collection Time: 11/08/15 11:57 AM  Result Value Ref Range Status   Specimen Description BLOOD LEFT HAND  Final   Special Requests BOTTLES DRAWN AEROBIC AND ANAEROBIC 5CC  Final   Culture NO GROWTH 1 DAY  Final   Report Status PENDING  Incomplete  Culture, blood (Routine X 2) w Reflex to ID Panel     Status: None (Preliminary result)   Collection Time: 11/08/15 11:57 AM  Result Value Ref Range Status   Specimen Description BLOOD RIGHT HAND  Final   Special Requests BOTTLES DRAWN AEROBIC AND ANAEROBIC 5CC  Final    Culture NO GROWTH 1 DAY  Final   Report Status PENDING  Incomplete    Studies/Results: No results found.    Assessment/Plan:  INTERVAL HISTORY:   TTE without vegeations   Principal Problem:   Urinary retention Active Problems:   Hypertension   History of DVT (deep vein thrombosis)-? 2012   CAP (community acquired pneumonia)   Transient complete heart block vs blocked PACs   Sepsis (HCC)   Poor dentition   Mobitz (type) I (Wenckebach's) atrioventricular block   Enterococcal bacteremia   Candida UTI   Acute cystitis without hematuria    Malik Mullins is a 60 y.o. male with  Enterococcal bacteremia likely from urinary source also with CKD and acute urinary retention  #1 Enterococcal bacteremia likely from urinary source:  --continue AMP --repeat blood cultures are incubating -- TTE  negative for vegeations so would get TEE to evaluate for endocarditis --if no endocarditis would treat with AMP alone for 2 weeks if endocarditis weigh AMP + gent vs AMP + high dose Ceftriaxone   Dr. Orvan Falconer is available for questions this weekend and I will be back on Monday.   LOS: 3 days   Acey Lav 11/09/2015, 8:00 PM

## 2015-11-10 ENCOUNTER — Inpatient Hospital Stay (HOSPITAL_COMMUNITY): Payer: Medicare Other

## 2015-11-10 DIAGNOSIS — R140 Abdominal distension (gaseous): Secondary | ICD-10-CM

## 2015-11-10 DIAGNOSIS — R7881 Bacteremia: Secondary | ICD-10-CM

## 2015-11-10 DIAGNOSIS — B952 Enterococcus as the cause of diseases classified elsewhere: Secondary | ICD-10-CM

## 2015-11-10 LAB — HEPATITIS C ANTIBODY (REFLEX): HCV AB: 0.1 {s_co_ratio} (ref 0.0–0.9)

## 2015-11-10 LAB — HCV COMMENT:

## 2015-11-10 NOTE — Progress Notes (Addendum)
Patient had 2.06 second pause, and HR went down to 30s per telemetry.  Patient asymptomatic and resting comfortably.  HR now in 50 to 60s, NSR.  Will notified PA on-call.  Will continue to monitor.

## 2015-11-10 NOTE — Progress Notes (Signed)
Pt voided 300 ml of clear, yellow urine without difficulty after foley catheter removal.

## 2015-11-10 NOTE — Progress Notes (Signed)
Pt NPO for abdominal US.

## 2015-11-10 NOTE — Progress Notes (Signed)
[facility]  Patient Status:   New pt for [facility] this admission  [facility] is providing the following services: HHRN and [facility] for home IV ABX.  In hospital teaching regarding IV ABX set up and administration.  Girlfriend, Marliss Coots, will be primary caregiver but was not present for teaching last p.m.  [facility] HHRN will teach her once home on admission.  [facility] is prepared to take Mr. Jurgens home over the weekend if ordered but we will prescriptions for the IV ABX.  If patient discharges after hours, please call [phone].   Sedalia Muta 11/10/2015, 7:24 AM

## 2015-11-10 NOTE — Progress Notes (Signed)
[facility] PROGRESS NOTE  Malik Mullins:[id number] DOB: 07-30-1956 DOA: 11/06/2015 PCP: PROVIDER NOT IN SYSTEM  brief narrative 60 year old male with history of hypertension, bilateral lower leg DVT, who presented to the ED initially for urinary retention which was relieved after a Foley was placed in. In the ED he was found to have fever with chills (temperature 100.3 Fahrenheit) and decided to the 70s on room air. He also complained of having cough with greenish sputum. Gestational in the ED showed bibasilar infiltrate concerning for pneumonia and was admitted to hospitalist service.  Assessment/Plan: Acute hypoxic respiratory failure with bilateral lobar pneumonia with enterococcus bacteremia Blood cultures on admission growing enterococcus in both bottles, likely from urinary source . Sensitive to ampicillin and antibiotics changed.  -2-D echo negative for vegetation. TEE is recommended by ID. (Possibly scheduled for/27)  Patient remains afebrile and denies further symptoms. -Repeat blood cultures negative for growth.   Acute urinary retention with UTI Cultures growing enterococcus. Sensitive to ampicillin.. Continue Foley. Renal ultrasound does not show any hydronephrosis or current on prostate enlargement. Patient possibly has BPH as he gives history of having urinary urgency and hesitancy. I have added Flomax.  -voiding trial this  morning, if failed would need Foley placed back in with outpatient urology follow-up.  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 Now has Mobitz type I and 2 with 2:1 AV block. In by EP again considering pacemaker, but needs to have his current infection resolved. Will follow-up as outpatient. Genitalia telemetry monitoring.  Elevated lactic acid On admission.  resolved with IV fluids.  CKD stage 3 Renal function at baseline. [facility]  Constipation Improved with MiraLAX.  Abdominal fullness with distention Noted since admission. Check abdominal ultrasound to further evaluate.  DVT prophylaxis: Subcutaneous heparin Diet: Regular   Code Status: Full code Family Communication: None at bedside  Disposition Plan: Pending TEE early next week  Consultants:  ID  Cardiology/EP  Procedures:  2d echo  Renal US  Abdominal ultrasound (pending)  Antibiotics:  IV vanco and zosyn since 2/21--2/23  Ampicillin 2/23--  HPI/Subjective: Seen and examined. Overnight again had second-degree heart block with heart rate down to 30s early this morning. Denies any symptoms.   Objective: Filed Vitals:   11/10/15 0519 11/10/15 0924  BP: 97/39 146/73  Pulse: 106 80  Temp: 98.2 F (36.8 C) 97.3 F (36.3 C)  Resp: 18 18    Intake/Output Summary (Last 24 hours) at 11/10/15 1116 Last data filed at 11/10/15 0924  Gross per 24 hour  Intake   1310 ml  Output   4801 ml  Net  -3491 ml   Filed Weights   11/06/15 0446 11/08/15 0100 11/10/15 0519  Weight: 109.77 kg (242 lb) 109.317 kg (241 lb) 109.725 kg (241 lb 14.4 oz)    Exam:   General: no acute distress  HEENT:  moist mucosa  Chest: Clear bilaterally  CVS: Normal S1 and S2, no murmurs rubs or gallop  GI: Soft, distended tense abdomen, nontender, bowel sounds present  Musculoskeletal: warm, no edema, Foley draining clear urine    Data Reviewed: Basic Metabolic Panel:  Recent Labs Lab 11/06/15 0324 11/07/15 1900 11/09/15 0535  NA 139 139 141  K 4.1 3.7 4.1  CL 103 106 109  CO2 19* 21* 25  GLUCOSE 118* 143* 103*  BUN CREATININE 2.00* 1.80* 1.67*  CALCIUM 9.3  8.6* 9.1  MG  --  1.7  --    Liver Function Tests:  Recent Labs Lab 11/06/15 0324  AST 30  ALT 19  ALKPHOS 100  BILITOT 0.9  PROT 7.3  ALBUMIN 4.0   No results for input(s): LIPASE, AMYLASE in the last 168 hours. No results for input(s): AMMONIA in the  last 168 hours. CBC:  Recent Labs Lab 11/06/15 0324  WBC 9.8  NEUTROABS 8.9*  HGB 16.0  HCT 50.3  MCV 84.5  PLT 152   Cardiac Enzymes: No results for input(s): CKTOTAL, CKMB, CKMBINDEX, TROPONINI in the last 168 hours. BNP (last 3 results)  Recent Labs  11/06/15 0324  BNP 18.8    ProBNP (last 3 results) No results for input(s): PROBNP in the last 8760 hours.  CBG:  Recent Labs Lab 11/07/15 0745 11/08/15 2306  GLUCAP 120* 109*    Recent Results (from the past 240 hour(s))  Blood Culture (routine x 2)     Status: None   Collection Time: 11/06/15  3:24 AM  Result Value Ref Range Status   Specimen Description BLOOD RIGHT ARM  Final   Special Requests BOTTLES DRAWN AEROBIC AND ANAEROBIC  Final   Culture  Setup Time   Final    GRAM POSITIVE COCCI IN CHAINS IN PAIRS IN BOTH AEROBIC AND ANAEROBIC BOTTLES CRITICAL RESULT CALLED TO, READ BACK BY AND VERIFIED WITH: Clifton James RN 2114 11/06/15 A BROWNING    Culture ENTEROCOCCUS SPECIES  Final   Report Status 11/09/2015 FINAL  Final   Organism ID, Bacteria ENTEROCOCCUS SPECIES  Final      Susceptibility   Enterococcus species - MIC*    AMPICILLIN <=2 SENSITIVE Sensitive     VANCOMYCIN 1 SENSITIVE Sensitive     GENTAMICIN SYNERGY SENSITIVE Sensitive     * ENTEROCOCCUS SPECIES  Blood Culture (routine x 2)     Status: None   Collection Time: 11/06/15  3:40 AM  Result Value Ref Range Status   Specimen Description BLOOD RIGHT HAND  Final   Special Requests BOTTLES DRAWN AEROBIC AND ANAEROBIC  Final   Culture  Setup Time   Final    GRAM POSITIVE COCCI IN CHAINS IN PAIRS IN BOTH AEROBIC AND ANAEROBIC BOTTLES CRITICAL RESULT CALLED TO, READ BACK BY AND VERIFIED WITH: Clifton James RN 2114 11/06/15 A BROWNING    Culture   Final    ENTEROCOCCUS SPECIES SUSCEPTIBILITIES PERFORMED ON PREVIOUS CULTURE WITHIN THE LAST 5 DAYS.    Report Status 11/09/2015 FINAL  Final  Urine culture     Status: None   Collection Time:  11/06/15  4:07 AM  Result Value Ref Range Status   Specimen Description URINE, RANDOM  Final   Special Requests NONE  Final   Culture >=100,000 COLONIES/mL ENTEROCOCCUS SPECIES  Final   Report Status 11/08/2015 FINAL  Final   Organism ID, Bacteria ENTEROCOCCUS SPECIES  Final      Susceptibility   Enterococcus species - MIC*    AMPICILLIN <=2 SENSITIVE Sensitive     LEVOFLOXACIN 0.5 SENSITIVE Sensitive     NITROFURANTOIN <=16 SENSITIVE Sensitive     VANCOMYCIN 1 SENSITIVE Sensitive     * >=100,000 COLONIES/mL ENTEROCOCCUS SPECIES  Culture, sputum-assessment     Status: None   Collection Time: 11/06/15  9:25 AM  Result Value Ref Range Status   Specimen Description SPUTUM  Final   Special Requests NONE  Final   Sputum evaluation   Final  THIS SPECIMEN IS ACCEPTABLE. RESPIRATORY CULTURE REPORT TO FOLLOW.   Report Status 11/06/2015 FINAL  Final  Gram stain     Status: None   Collection Time: 11/06/15  9:26 AM  Result Value Ref Range Status   Specimen Description SPUTUM  Final   Special Requests NONE  Final   Gram Stain   Final    MODERATE WBC PRESENT, PREDOMINANTLY PMN MODERATE GRAM POSITIVE COCCI IN PAIRS FEW GRAM NEGATIVE RODS RARE GRAM POSITIVE RODS    Report Status 11/06/2015 FINAL  Final  Culture, respiratory (NON-Expectorated)     Status: None   Collection Time: 11/06/15  9:26 AM  Result Value Ref Range Status   Specimen Description SPUTUM  Final   Special Requests NONE  Final   Gram Stain   Final    MODERATE WBC PRESENT, PREDOMINANTLY PMN FEW SQUAMOUS EPITHELIAL CELLS PRESENT MODERATE GRAM POSITIVE COCCI IN PAIRS FEW GRAM NEGATIVE RODS RARE GRAM POSITIVE RODS Performed at [facility] THIS SPECIMEN IS ACCEPTABLE FOR SPUTUM CULTURE Performed at [facility]    Culture   Final    NORMAL OROPHARYNGEAL FLORA Performed at [facility]    Report Status 11/09/2015 FINAL  Final  Culture, blood (Routine X 2) w Reflex to ID Panel     Status: None  (Preliminary result)   Collection Time: 11/08/15 11:57 AM  Result Value Ref Range Status   Specimen Description BLOOD LEFT HAND  Final   Special Requests BOTTLES DRAWN AEROBIC AND ANAEROBIC 5CC  Final   Culture NO GROWTH 1 DAY  Final   Report Status PENDING  Incomplete  Culture, blood (Routine X 2) w Reflex to ID Panel     Status: None (Preliminary result)   Collection Time: 11/08/15 11:57 AM  Result Value Ref Range Status   Specimen Description BLOOD RIGHT HAND  Final   Special Requests BOTTLES DRAWN AEROBIC AND ANAEROBIC 5CC  Final   Culture NO GROWTH 1 DAY  Final   Report Status PENDING  Incomplete     Studies: No results found.  Scheduled Meds: . ampicillin (OMNIPEN) IV  2 g Intravenous 4 times per day  . heparin  5,000 Units Subcutaneous 3 times per day  . oxyCODONE  10 mg Oral Q12H  . polyethylene glycol  17 g Oral Daily  . tamsulosin  0.4 mg Oral QPC supper   Continuous Infusions: . sodium chloride 75 mL/hr at 11/08/15 2312      Time spent:25 minutes    Marquesha Robideau  [facility] Pager [phone]. If 7PM-7AM, please contact night-coverage at [email or web address], password [facility] 11/10/2015, 11:16 AM  LOS: 4 days

## 2015-11-10 NOTE — Progress Notes (Signed)
Removed foley catheter.

## 2015-11-10 NOTE — Progress Notes (Signed)
SUBJECTIVE: The patient is doing well today.  At this time, he denies chest pain, shortness of breath, or any new concerns.  Marland Kitchen ampicillin (OMNIPEN) IV  2 g Intravenous 4 times per day  . heparin  5,000 Units Subcutaneous 3 times per day  . oxyCODONE  10 mg Oral Q12H  . polyethylene glycol  17 g Oral Daily  . tamsulosin  0.4 mg Oral QPC supper   . sodium chloride 75 mL/hr at 11/08/15 2312    OBJECTIVE: Physical Exam: Filed Vitals:   11/09/15 1000 11/09/15 1722 11/09/15 2245 11/10/15 0519  BP: 119/80 136/86 116/59 97/39  Pulse: 75 75 78 106  Temp: 97.8 F (36.6 C) 98.4 F (36.9 C) 98.6 F (37 C) 98.2 F (36.8 C)  TempSrc: Oral Oral Oral Oral  Resp: Height:      Weight:    241 lb 14.4 oz (109.725 kg)  SpO2: 98% 97% 97% 96%    Intake/Output Summary (Last 24 hours) at 11/10/15 0919 Last data filed at 11/10/15 0900  Gross per 24 hour  Intake   1190 ml  Output   5501 ml  Net  -4311 ml    Telemetry reveals sinus rhythm  GEN- The patient is well appearing, alert and oriented x 3 today.   Head- normocephalic, atraumatic Eyes-  Sclera clear, conjunctiva pink Ears- hearing intact Oropharynx- clear Neck- supple, no JVP Lymph- no cervical lymphadenopathy Lungs- Clear to ausculation bilaterally, normal work of breathing Heart- Regular rate and rhythm, no murmurs, rubs or gallops, PMI not laterally displaced GI- soft, NT, ND, + BS Extremities- no clubbing, cyanosis, or edema Skin- no rash or lesion Psych- euthymic mood, full affect Neuro- strength and sensation are intact  LABS: Basic Metabolic Panel:  Recent Labs  16/10/96 1900 11/09/15 0535  NA 139 141  K 3.7 4.1  CL 106 109  CO2 21* 25  GLUCOSE 143* 103*  BUN 12 12  CREATININE 1.80* 1.67*  CALCIUM 8.6* 9.1  MG 1.7  --    Liver Function Tests: No results for input(s): AST, ALT, ALKPHOS, BILITOT, PROT, ALBUMIN in the last 72 hours. No results for input(s): LIPASE, AMYLASE in the last 72  hours. CBC: No results for input(s): WBC, NEUTROABS, HGB, HCT, MCV, PLT in the last 72 hours. Cardiac Enzymes: No results for input(s): CKTOTAL, CKMB, CKMBINDEX, TROPONINI in the last 72 hours. BNP: Invalid input(s): POCBNP D-Dimer: No results for input(s): DDIMER in the last 72 hours. Hemoglobin A1C: No results for input(s): HGBA1C in the last 72 hours. Fasting Lipid Panel: No results for input(s): CHOL, HDL, LDLCALC, TRIG, CHOLHDL, LDLDIRECT in the last 72 hours. Thyroid Function Tests: No results for input(s): TSH, T4TOTAL, T3FREE, THYROIDAB in the last 72 hours.  Invalid input(s): FREET3 Anemia Panel: No results for input(s): VITAMINB12, FOLATE, FERRITIN, TIBC, IRON, RETICCTPCT in the last 72 hours.  RADIOLOGY: US Renal  11/07/2015  CLINICAL DATA:  Two day history of urinary retention.  Hypertension. EXAM: RENAL ULTRASOUND COMPARISON:  December 07, 2008 FINDINGS: Right Kidney: Length: 10.5 cm. Echogenicity and renal cortical thickness are within normal limits. No mass, perinephric fluid, or hydronephrosis visualized. There is scarring in the lower pole right kidney region. No sonographically demonstrable calculus or ureterectasis. Left Kidney: Length: 11.2 cm. Echogenicity and renal cortical thickness are within normal limits. No mass perinephric fluid, or hydronephrosis visualized. No sonographically demonstrable calculus or ureterectasis. Bladder: Decompressed with a Foley catheter and cannot be assessed. IMPRESSION: Urinary bladder  is decompressed with a Foley catheter and cannot be assessed. Scarring lower pole right kidney. Kidneys bilaterally otherwise appear unremarkable. Electronically Signed   By: Bretta Bang III M.D.   On: 11/07/2015 14:50   Dg Chest Port 1 View  11/06/2015  CLINICAL DATA:  Acute onset of fever and hypoxia. Initial encounter. EXAM: PORTABLE CHEST 1 VIEW COMPARISON:  Chest radiograph performed 09/25/2014 FINDINGS: The lungs are well-aerated. Mild vascular  congestion is noted. Mild bibasilar opacities may reflect atelectasis or possibly mild infection, depending on the patient's symptoms. There is no evidence of pleural effusion or pneumothorax. The cardiomediastinal silhouette is mildly enlarged. No acute osseous abnormalities are seen. IMPRESSION: Mild vascular congestion and mild cardiomegaly. Bibasilar opacities may reflect atelectasis or possibly mild infection, depending on the patient's symptoms. Electronically Signed   By: Roanna Raider M.D.   On: 11/06/2015 04:18    ASSESSMENT AND PLAN:  Principal Problem:   Urinary retention Active Problems:   Hypertension   History of DVT (deep vein thrombosis)-? 2012   CAP (community acquired pneumonia)   Transient complete heart block vs blocked PACs   Sepsis (HCC)   Poor dentition   Mobitz (type) I (Wenckebach's) atrioventricular block   Enterococcal bacteremia   Candida UTI   Acute cystitis without hematuria Enterococcus bacteremia: On ampicillin with plans for IV medications at home.  ID recommends TEE to assess for endocarditis.  [place] plan for Monday.  Discussed risks of the procedure with the patient including bleeding, and damage to the esophagus.  Heart block: has had mobitz I and II as well as 2:1 AV block.  May need a pacemaker in the future but infectious issues should be cleared up.  Should follow up in EP clinic after antibiotics are done to further discuss.  Patient, at this point, does not want pacemaker as he is asymptomatic.   Tabathia Knoche Jorja Loa, MD 11/10/2015 9:19 AM

## 2015-11-10 NOTE — Progress Notes (Signed)
Called by [employer]. Pt. With HR 53, 2nd degree type 2. Pt asleep,easily awakened,without complaints. Dr.McAlhaney's answering service called and message left to inform of change.

## 2015-11-11 NOTE — Progress Notes (Signed)
Patient stated that MD had not spoken to him regarding the details of his TEE.  Consent form cannot be signed at this time.

## 2015-11-11 NOTE — Progress Notes (Signed)
[facility] PROGRESS NOTE  Malik Mullins ZOX:[id number] DOB: 1955-10-22 DOA: 11/06/2015 PCP: PROVIDER NOT IN SYSTEM  brief narrative 60 year old male with history of hypertension, bilateral lower leg DVT, who presented to the ED initially for urinary retention which was relieved after a Foley was placed in. In the ED he was found to have fever with chills (temperature 100.3 Fahrenheit) and decided to the 70s on room air. He also complained of having cough with greenish sputum. Gestational in the ED showed bibasilar infiltrate concerning for pneumonia and was admitted to hospitalist service.  Assessment/Plan: Acute hypoxic respiratory failure with bilateral lobar pneumonia with enterococcus bacteremia Blood cultures on admission growing enterococcus in both bottles, likely from urinary source . Sensitive to ampicillin and antibiotics changed.  -2-D echo negative for vegetation. TEE scheduled for tomorrow.  Patient remains afebrile and denies further symptoms. -Repeat blood cultures negative for growth.   Acute urinary retention with UTI Cultures growing enterococcus. Sensitive to ampicillin.Marland Kitchen Possibly due to enlarged prostate with bladder outlet obstruction. Started on Flomax. Foley removed on 2/25 and voiding well.  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 Now has Mobitz type I and 2 with 2:1 AV block. In by EP again considering pacemaker, but needs to have his current infection resolved. Will follow-up as outpatient. Continue telemetry monitoring. On discussion with his wife patient does snore heavily and possibly has symptoms of sleep apnea. This was recommended by EP in 2014 when he was admitted for syncopal symptoms. He would need outpatient sleep study.  Elevated lactic acid On admission.  resolved with IV fluids.  CKD stage 3 Renal function at baseline. [facility]  Constipation Improved with MiraLAX.  Abdominal fullness with distention Noted since admission. Abdominal ultrasound suggests gaseous distention and fatty liver. Wife reports that patient drinks 40 ounces of beer every day. Counseled on cessation.   DVT prophylaxis: Subcutaneous heparin Diet: Regular   Code Status: Full code Family Communication: None at bedside  Disposition Plan: TEE tomorrow. Depending upon results he will go home with a PICC line and IV antibiotics.  Consultants:  ID  Cardiology/EP  Procedures:  2d echo  Renal US  Abdominal ultrasound  Antibiotics:  IV vanco and zosyn since 2/21--2/23  Ampicillin 2/23--  HPI/Subjective: Seen and examined. Reportedly had to second part with heart rate dropping down to 30s this morning briefly. Asymptomatic.  Objective: Filed Vitals:   11/10/15 2042 11/11/15 0432  BP: 113/72 106/81  Pulse: 76 72  Temp: 98 F (36.7 C) 98.4 F (36.9 C)  Resp: 20 21    Intake/Output Summary (Last 24 hours) at 11/11/15 1346 Last data filed at 11/11/15 1335  Gross per 24 hour  Intake   2770 ml  Output   4450 ml  Net  -1680 ml   Filed Weights   11/08/15 0100 11/10/15 0519 11/10/15 2042  Weight: 109.317 kg (241 lb) 109.725 kg (241 lb 14.4 oz) 107.049 kg (236 lb)    Exam:   General: no acute distress  HEENT:  moist mucosa  Chest: Clear bilaterally  CVS: Normal S1 and S2, no murmurs rubs or gallop  GI: Soft, distended tense abdomen, nontender, bowel sounds present  Musculoskeletal: warm, no edema,     Data Reviewed: Basic Metabolic Panel:  Recent Labs Lab 11/06/15 0324 11/07/15 1900 11/09/15 0535  NA 139 139 141  K 4.1 3.7 4.1  CL 103 106 109  CO2 19* 21* 25  GLUCOSE 118* 143* 103*  BUN 20 12 12   CREATININE 2.00* 1.80* 1.67*  CALCIUM 9.3 8.6* 9.1  MG  --  1.7  --    Liver Function Tests:  Recent Labs Lab 11/06/15 0324  AST 30  ALT 19  ALKPHOS 100  BILITOT 0.9  PROT 7.3  ALBUMIN  4.0   No results for input(s): LIPASE, AMYLASE in the last 168 hours. No results for input(s): AMMONIA in the last 168 hours. CBC:  Recent Labs Lab 11/06/15 0324  WBC 9.8  NEUTROABS 8.9*  HGB 16.0  HCT 50.3  MCV 84.5  PLT 152   Cardiac Enzymes: No results for input(s): CKTOTAL, CKMB, CKMBINDEX, TROPONINI in the last 168 hours. BNP (last 3 results)  Recent Labs  11/06/15 0324  BNP 18.8    ProBNP (last 3 results) No results for input(s): PROBNP in the last 8760 hours.  CBG:  Recent Labs Lab 11/07/15 0745 11/08/15 2306  GLUCAP 120* 109*    Recent Results (from the past 240 hour(s))  Blood Culture (routine x 2)     Status: None   Collection Time: 11/06/15  3:24 AM  Result Value Ref Range Status   Specimen Description BLOOD RIGHT ARM  Final   Special Requests BOTTLES DRAWN AEROBIC AND ANAEROBIC  Final   Culture  Setup Time   Final    GRAM POSITIVE COCCI IN CHAINS IN PAIRS IN BOTH AEROBIC AND ANAEROBIC BOTTLES CRITICAL RESULT CALLED TO, READ BACK BY AND VERIFIED WITH: Clifton James RN 2114 11/06/15 A BROWNING    Culture ENTEROCOCCUS SPECIES  Final   Report Status 11/09/2015 FINAL  Final   Organism ID, Bacteria ENTEROCOCCUS SPECIES  Final      Susceptibility   Enterococcus species - MIC*    AMPICILLIN <=2 SENSITIVE Sensitive     VANCOMYCIN 1 SENSITIVE Sensitive     GENTAMICIN SYNERGY SENSITIVE Sensitive     * ENTEROCOCCUS SPECIES  Blood Culture (routine x 2)     Status: None   Collection Time: 11/06/15  3:40 AM  Result Value Ref Range Status   Specimen Description BLOOD RIGHT HAND  Final   Special Requests BOTTLES DRAWN AEROBIC AND ANAEROBIC  Final   Culture  Setup Time   Final    GRAM POSITIVE COCCI IN CHAINS IN PAIRS IN BOTH AEROBIC AND ANAEROBIC BOTTLES CRITICAL RESULT CALLED TO, READ BACK BY AND VERIFIED WITH: Clifton James RN 2114 11/06/15 A BROWNING    Culture   Final    ENTEROCOCCUS SPECIES SUSCEPTIBILITIES PERFORMED ON PREVIOUS CULTURE WITHIN THE  LAST 5 DAYS.    Report Status 11/09/2015 FINAL  Final  Urine culture     Status: None   Collection Time: 11/06/15  4:07 AM  Result Value Ref Range Status   Specimen Description URINE, RANDOM  Final   Special Requests NONE  Final   Culture >=100,000 COLONIES/mL ENTEROCOCCUS SPECIES  Final   Report Status 11/08/2015 FINAL  Final   Organism ID, Bacteria ENTEROCOCCUS SPECIES  Final      Susceptibility   Enterococcus species - MIC*    AMPICILLIN <=2 SENSITIVE Sensitive     LEVOFLOXACIN 0.5 SENSITIVE Sensitive     NITROFURANTOIN <=16 SENSITIVE Sensitive     VANCOMYCIN 1 SENSITIVE Sensitive     * >=100,000 COLONIES/mL ENTEROCOCCUS SPECIES  Culture, sputum-assessment     Status: None   Collection Time: 11/06/15  9:25 AM  Result Value Ref Range Status  Specimen Description SPUTUM  Final   Special Requests NONE  Final   Sputum evaluation   Final    THIS SPECIMEN IS ACCEPTABLE. RESPIRATORY CULTURE REPORT TO FOLLOW.   Report Status 11/06/2015 FINAL  Final  Gram stain     Status: None   Collection Time: 11/06/15  9:26 AM  Result Value Ref Range Status   Specimen Description SPUTUM  Final   Special Requests NONE  Final   Gram Stain   Final    MODERATE WBC PRESENT, PREDOMINANTLY PMN MODERATE GRAM POSITIVE COCCI IN PAIRS FEW GRAM NEGATIVE RODS RARE GRAM POSITIVE RODS    Report Status 11/06/2015 FINAL  Final  Culture, respiratory (NON-Expectorated)     Status: None   Collection Time: 11/06/15  9:26 AM  Result Value Ref Range Status   Specimen Description SPUTUM  Final   Special Requests NONE  Final   Gram Stain   Final    MODERATE WBC PRESENT, PREDOMINANTLY PMN FEW SQUAMOUS EPITHELIAL CELLS PRESENT MODERATE GRAM POSITIVE COCCI IN PAIRS FEW GRAM NEGATIVE RODS RARE GRAM POSITIVE RODS Performed at [facility] THIS SPECIMEN IS ACCEPTABLE FOR SPUTUM CULTURE Performed at [facility]    Culture   Final    NORMAL OROPHARYNGEAL FLORA Performed at [facility]     Report Status 11/09/2015 FINAL  Final  Culture, blood (Routine X 2) w Reflex to ID Panel     Status: None (Preliminary result)   Collection Time: 11/08/15 11:57 AM  Result Value Ref Range Status   Specimen Description BLOOD LEFT HAND  Final   Special Requests BOTTLES DRAWN AEROBIC AND ANAEROBIC 5CC  Final   Culture NO GROWTH 3 DAYS  Final   Report Status PENDING  Incomplete  Culture, blood (Routine X 2) w Reflex to ID Panel     Status: None (Preliminary result)   Collection Time: 11/08/15 11:57 AM  Result Value Ref Range Status   Specimen Description BLOOD RIGHT HAND  Final   Special Requests BOTTLES DRAWN AEROBIC AND ANAEROBIC 5CC  Final   Culture NO GROWTH 3 DAYS  Final   Report Status PENDING  Incomplete     Studies: US Abdomen Complete  11/10/2015  CLINICAL DATA:  60 year old presenting with acute onset of abdominal distension. EXAM: ABDOMEN ULTRASOUND COMPLETE COMPARISON:  Urinary tract ultrasound 11/12/2015, 12/07/2008. FINDINGS: The examination is technically difficult due to abundant bowel gas. Gallbladder: No shadowing gallstones or echogenic sludge. No gallbladder wall thickening or pericholecystic fluid. Negative sonographic Murphy sign according to the ultrasound technologist. Common bile duct: Diameter: Approximately 5 mm. Liver: Diffusely increased and coarsened echotexture without focal hepatic parenchymal abnormality. Patent portal vein with hepatopetal flow. IVC: Patent in its intrahepatic portion. Obscured outside the liver by bowel gas. Pancreas: Obscured by bowel gas. Spleen: Normal size and echotexture without focal parenchymal abnormality. Right Kidney: Length: Approximately 9.4 cm. Scarring and marked cortical thinning involving the lower pole. No hydronephrosis. No focal parenchymal abnormality. No shadowing calculi. Left Kidney: Length: Approximately 11.7 cm. No hydronephrosis. Well-preserved cortex. No shadowing calculi. Normal parenchymal echotexture. No focal  parenchymal abnormality. Abdominal aorta: Normal in caliber proximally measuring approximately 2.7 cm. Obscured in its mid and distal portion by bowel gas. Other findings: No evidence of ascites. IMPRESSION: 1. Technically limited examination due to abundant bowel gas which obscured the extrahepatic IVC, pancreas, and mid and distal abdominal aorta. Perhaps the bowel gas accounts for the abdominal distention. 2. Diffuse hepatic steatosis and/or hepatocellular disease without focal hepatic parenchymal  abnormality. 3. Scarring involving the lower pole of the right kidney. Electronically Signed   By: Hulan Saas M.D.   On: 11/10/2015 18:29    Scheduled Meds: . ampicillin (OMNIPEN) IV  2 g Intravenous 4 times per day  . heparin  5,000 Units Subcutaneous 3 times per day  . oxyCODONE  10 mg Oral Q12H  . polyethylene glycol  17 g Oral Daily  . tamsulosin  0.4 mg Oral QPC supper   Continuous Infusions: . sodium chloride 75 mL/hr at 11/08/15 2312      Time spent:25 minutes    Shooter Tangen  [facility] Pager [phone]. If 7PM-7AM, please contact night-coverage at [email or web address], password [facility] 11/11/2015, 1:46 PM  LOS: 5 days

## 2015-11-12 ENCOUNTER — Inpatient Hospital Stay (HOSPITAL_COMMUNITY): Payer: Medicare Other

## 2015-11-12 DIAGNOSIS — M79632 Pain in left forearm: Secondary | ICD-10-CM

## 2015-11-12 DIAGNOSIS — N189 Chronic kidney disease, unspecified: Secondary | ICD-10-CM

## 2015-11-12 DIAGNOSIS — M79603 Pain in arm, unspecified: Secondary | ICD-10-CM | POA: Diagnosis present

## 2015-11-12 DIAGNOSIS — R140 Abdominal distension (gaseous): Secondary | ICD-10-CM | POA: Diagnosis present

## 2015-11-12 NOTE — Progress Notes (Signed)
[facility] PROGRESS NOTE  Malik Mullins ZOX:[id number] DOB: 1955/09/19 DOA: 11/06/2015 PCP: Dr Merton Border  brief narrative 60 year old male with history of hypertension, bilateral lower leg DVT, who presented to the ED initially for urinary retention which was relieved after a Foley was placed in. In the ED he was found to have fever with chills (temperature 100.3 Fahrenheit) and decided to the 70s on room air. He also complained of having cough with greenish sputum. Gestational in the ED showed bibasilar infiltrate concerning for pneumonia and was admitted to hospitalist service.  Assessment/Plan: Acute hypoxic respiratory failure with bilateral lobar pneumonia with enterococcus bacteremia Blood cultures on admission growing enterococcus in both bottles, likely from urinary source . Sensitive to ampicillin and antibiotics changed.  -2-D echo negative for vegetation. TEE scheduled for tomorrow. - afebrile and asymptomatic. -Repeat blood cultures negative for growth.   Acute urinary retention with UTI Cultures growing enterococcus. Sensitive to ampicillin.Marland Kitchen Possibly due to enlarged prostate with bladder outlet obstruction. Started on Flomax. Foley removed on 2/25 and voiding well.  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 Now has Mobitz type I and 2 with 2:1 AV block. In by EP again considering pacemaker, but needs to have his current infection resolved. Will follow-up as outpatient. Continue telemetry monitoring. On discussion with his wife patient does snore heavily and possibly has symptoms of sleep apnea. This was recommended by EP in 2014 when he was admitted for syncopal symptoms. He would need outpatient sleep study.  Elevated lactic acid On admission.  resolved with IV fluids.  CKD stage 3 Renal function at baseline. [facility]  Constipation Improved with MiraLAX.  Abdominal  fullness with distention Noted since admission. Abdominal ultrasound suggests gaseous distention and fatty liver. Wife reports that patient drinks 40 ounces of beer every day. Counseled on cessation.   DVT prophylaxis: Subcutaneous heparin Diet: Regular   Code Status: Full code Family Communication: None at bedside  Disposition Plan: TEE tomorrow. Depending upon results he will go home with a PICC line and IV antibiotics.  Consultants:  ID  Cardiology/EP  Procedures:  2d echo  Renal US  Abdominal ultrasound  Antibiotics:  IV vanco and zosyn since 2/21--2/23  Ampicillin 2/23--  HPI/Subjective: Seen and examined. C/o left forearm pain and had appt to see a surgeon tomorrow. I asked him to reschedule.   Objective: Filed Vitals:   11/12/15 0410 11/12/15 0900  BP: 133/93 136/99  Pulse: 72 86  Temp: 98.2 F (36.8 C) 97.8 F (36.6 C)  Resp:  18    Intake/Output Summary (Last 24 hours) at 11/12/15 1315 Last data filed at 11/12/15 1300  Gross per 24 hour  Intake 3507.5 ml  Output   5525 ml  Net -2017.5 ml   Filed Weights   11/10/15 0519 11/10/15 2042 11/11/15 2023  Weight: 109.725 kg (241 lb 14.4 oz) 107.049 kg (236 lb) 109.317 kg (241 lb)    Exam:   General: no acute distress  HEENT:  moist mucosa  Chest: Clear bilaterally  CVS: Normal S1 and S2, no murmurs rubs or gallop  GI: Soft, distended tense abdomen, nontender,   Musculoskeletal: warm, no edema,     Data Reviewed: Basic Metabolic Panel:  Recent Labs Lab 11/06/15 0324 11/07/15 1900 11/09/15 0535  NA 139 139 141  K 4.1 3.7 4.1  CL 103 106 109  CO2 19* 21* 25  GLUCOSE 118* 143* 103*  BUN 20 12 12   CREATININE 2.00* 1.80* 1.67*  CALCIUM 9.3 8.6* 9.1  MG  --  1.7  --    Liver Function Tests:  Recent Labs Lab 11/06/15 0324  AST 30  ALT 19  ALKPHOS 100  BILITOT 0.9  PROT 7.3  ALBUMIN 4.0   No results for input(s): LIPASE, AMYLASE in the last 168 hours. No results for  input(s): AMMONIA in the last 168 hours. CBC:  Recent Labs Lab 11/06/15 0324  WBC 9.8  NEUTROABS 8.9*  HGB 16.0  HCT 50.3  MCV 84.5  PLT 152   Cardiac Enzymes: No results for input(s): CKTOTAL, CKMB, CKMBINDEX, TROPONINI in the last 168 hours. BNP (last 3 results)  Recent Labs  11/06/15 0324  BNP 18.8    ProBNP (last 3 results) No results for input(s): PROBNP in the last 8760 hours.  CBG:  Recent Labs Lab 11/07/15 0745 11/08/15 2306  GLUCAP 120* 109*    Recent Results (from the past 240 hour(s))  Blood Culture (routine x 2)     Status: None   Collection Time: 11/06/15  3:24 AM  Result Value Ref Range Status   Specimen Description BLOOD RIGHT ARM  Final   Special Requests BOTTLES DRAWN AEROBIC AND ANAEROBIC  Final   Culture  Setup Time   Final    GRAM POSITIVE COCCI IN CHAINS IN PAIRS IN BOTH AEROBIC AND ANAEROBIC BOTTLES CRITICAL RESULT CALLED TO, READ BACK BY AND VERIFIED WITH: Clifton James RN 2114 11/06/15 A BROWNING    Culture ENTEROCOCCUS SPECIES  Final   Report Status 11/09/2015 FINAL  Final   Organism ID, Bacteria ENTEROCOCCUS SPECIES  Final      Susceptibility   Enterococcus species - MIC*    AMPICILLIN <=2 SENSITIVE Sensitive     VANCOMYCIN 1 SENSITIVE Sensitive     GENTAMICIN SYNERGY SENSITIVE Sensitive     * ENTEROCOCCUS SPECIES  Blood Culture (routine x 2)     Status: None   Collection Time: 11/06/15  3:40 AM  Result Value Ref Range Status   Specimen Description BLOOD RIGHT HAND  Final   Special Requests BOTTLES DRAWN AEROBIC AND ANAEROBIC  Final   Culture  Setup Time   Final    GRAM POSITIVE COCCI IN CHAINS IN PAIRS IN BOTH AEROBIC AND ANAEROBIC BOTTLES CRITICAL RESULT CALLED TO, READ BACK BY AND VERIFIED WITH: Clifton James RN 2114 11/06/15 A BROWNING    Culture   Final    ENTEROCOCCUS SPECIES SUSCEPTIBILITIES PERFORMED ON PREVIOUS CULTURE WITHIN THE LAST 5 DAYS.    Report Status 11/09/2015 FINAL  Final  Urine culture     Status:  None   Collection Time: 11/06/15  4:07 AM  Result Value Ref Range Status   Specimen Description URINE, RANDOM  Final   Special Requests NONE  Final   Culture >=100,000 COLONIES/mL ENTEROCOCCUS SPECIES  Final   Report Status 11/08/2015 FINAL  Final   Organism ID, Bacteria ENTEROCOCCUS SPECIES  Final      Susceptibility   Enterococcus species - MIC*    AMPICILLIN <=2 SENSITIVE Sensitive     LEVOFLOXACIN 0.5 SENSITIVE Sensitive     NITROFURANTOIN <=16 SENSITIVE Sensitive     VANCOMYCIN 1 SENSITIVE Sensitive     * >=100,000 COLONIES/mL ENTEROCOCCUS SPECIES  Culture, sputum-assessment     Status: None   Collection Time: 11/06/15  9:25 AM  Result Value Ref Range Status   Specimen Description SPUTUM  Final   Special Requests NONE  Final   Sputum evaluation   Final    THIS SPECIMEN IS ACCEPTABLE. RESPIRATORY CULTURE REPORT TO FOLLOW.   Report Status 11/06/2015 FINAL  Final  Gram stain     Status: None   Collection Time: 11/06/15  9:26 AM  Result Value Ref Range Status   Specimen Description SPUTUM  Final   Special Requests NONE  Final   Gram Stain   Final    MODERATE WBC PRESENT, PREDOMINANTLY PMN MODERATE GRAM POSITIVE COCCI IN PAIRS FEW GRAM NEGATIVE RODS RARE GRAM POSITIVE RODS    Report Status 11/06/2015 FINAL  Final  Culture, respiratory (NON-Expectorated)     Status: None   Collection Time: 11/06/15  9:26 AM  Result Value Ref Range Status   Specimen Description SPUTUM  Final   Special Requests NONE  Final   Gram Stain   Final    MODERATE WBC PRESENT, PREDOMINANTLY PMN FEW SQUAMOUS EPITHELIAL CELLS PRESENT MODERATE GRAM POSITIVE COCCI IN PAIRS FEW GRAM NEGATIVE RODS RARE GRAM POSITIVE RODS Performed at [facility] THIS SPECIMEN IS ACCEPTABLE FOR SPUTUM CULTURE Performed at [facility]    Culture   Final    NORMAL OROPHARYNGEAL FLORA Performed at [facility]    Report Status 11/09/2015 FINAL  Final  Culture, blood (Routine X 2) w Reflex to  ID Panel     Status: None (Preliminary result)   Collection Time: 11/08/15 11:57 AM  Result Value Ref Range Status   Specimen Description BLOOD LEFT HAND  Final   Special Requests BOTTLES DRAWN AEROBIC AND ANAEROBIC 5CC  Final   Culture NO GROWTH 3 DAYS  Final   Report Status PENDING  Incomplete  Culture, blood (Routine X 2) w Reflex to ID Panel     Status: None (Preliminary result)   Collection Time: 11/08/15 11:57 AM  Result Value Ref Range Status   Specimen Description BLOOD RIGHT HAND  Final   Special Requests BOTTLES DRAWN AEROBIC AND ANAEROBIC 5CC  Final   Culture NO GROWTH 3 DAYS  Final   Report Status PENDING  Incomplete     Studies: US Abdomen Complete  11/10/2015  CLINICAL DATA:  60 year old presenting with acute onset of abdominal distension. EXAM: ABDOMEN ULTRASOUND COMPLETE COMPARISON:  Urinary tract ultrasound 11/12/2015, 12/07/2008. FINDINGS: The examination is technically difficult due to abundant bowel gas. Gallbladder: No shadowing gallstones or echogenic sludge. No gallbladder wall thickening or pericholecystic fluid. Negative sonographic Murphy sign according to the ultrasound technologist. Common bile duct: Diameter: Approximately 5 mm. Liver: Diffusely increased and coarsened echotexture without focal hepatic parenchymal abnormality. Patent portal vein with hepatopetal flow. IVC: Patent in its intrahepatic portion. Obscured outside the liver by bowel gas. Pancreas: Obscured by bowel gas. Spleen: Normal size and echotexture without focal parenchymal abnormality. Right Kidney: Length: Approximately 9.4 cm. Scarring and marked cortical thinning involving the lower pole. No hydronephrosis. No focal parenchymal abnormality. No shadowing calculi. Left Kidney: Length: Approximately 11.7 cm. No hydronephrosis. Well-preserved cortex. No shadowing calculi. Normal parenchymal echotexture. No focal parenchymal abnormality. Abdominal aorta: Normal in caliber proximally measuring  approximately 2.7 cm. Obscured in its mid and distal portion by bowel gas. Other findings: No evidence of ascites. IMPRESSION: 1. Technically limited examination due to abundant bowel gas which obscured the extrahepatic IVC, pancreas, and mid and distal abdominal aorta. Perhaps the bowel gas accounts for the abdominal distention. 2. Diffuse hepatic steatosis and/or hepatocellular disease without focal hepatic parenchymal abnormality. 3. Scarring involving the lower pole of the right kidney.  Electronically Signed   By: Hulan Saas M.D.   On: 11/10/2015 18:29    Scheduled Meds: . ampicillin (OMNIPEN) IV  2 g Intravenous 4 times per day  . heparin  5,000 Units Subcutaneous 3 times per day  . oxyCODONE  10 mg Oral Q12H  . polyethylene glycol  17 g Oral Daily  . tamsulosin  0.4 mg Oral QPC supper   Continuous Infusions: . sodium chloride 75 mL/hr at 11/11/15 1614      Time spent:25 minutes    Maliyah Willets  [facility] Pager [phone]. If 7PM-7AM, please contact night-coverage at [email or web address], password [facility] 11/12/2015, 1:15 PM  LOS: 6 days

## 2015-11-12 NOTE — Progress Notes (Signed)
   Please call if we are needed further this admission.

## 2015-11-12 NOTE — Progress Notes (Signed)
Subjective:  C/o left forearm pain   Antibiotics:  Anti-infectives    Start     Dose/Rate Route Frequency Ordered Stop   11/08/15 1200  ampicillin (OMNIPEN) 2 g in sodium chloride 0.9 % 50 mL IVPB     2 g 150 mL/hr over 20 Minutes Intravenous 4 times per day 11/08/15 1130     11/07/15 1000  vancomycin (VANCOCIN) 1,250 mg in sodium chloride 0.9 % 250 mL IVPB  Status:  Discontinued     1,250 mg 166.7 mL/hr over 90 Minutes Intravenous Every 24 hours 11/07/15 0919 11/08/15 1128   11/07/15 0930  piperacillin-tazobactam (ZOSYN) IVPB 3.375 g  Status:  Discontinued     3.375 g 12.5 mL/hr over 240 Minutes Intravenous 3 times per day 11/07/15 0919 11/08/15 1128   11/06/15 0545  cefTRIAXone (ROCEPHIN) 1 g in dextrose 5 % 50 mL IVPB  Status:  Discontinued     1 g 100 mL/hr over 30 Minutes Intravenous Every 24 hours 11/06/15 0535 11/07/15 0912   11/06/15 0545  azithromycin (ZITHROMAX) 500 mg in dextrose 5 % 250 mL IVPB  Status:  Discontinued     500 mg 250 mL/hr over 60 Minutes Intravenous Every 24 hours 11/06/15 0535 11/07/15 0912   11/06/15 0315  piperacillin-tazobactam (ZOSYN) IVPB 3.375 g     3.375 g 100 mL/hr over 30 Minutes Intravenous  Once 11/06/15 0301 11/06/15 0552   11/06/15 0315  vancomycin (VANCOCIN) IVPB 1000 mg/200 mL premix  Status:  Discontinued     1,000 mg 200 mL/hr over 60 Minutes Intravenous  Once 11/06/15 0301 11/06/15 0309   11/06/15 0315  vancomycin (VANCOCIN) 1,750 mg in sodium chloride 0.9 % 500 mL IVPB     1,750 mg 250 mL/hr over 120 Minutes Intravenous  Once 11/06/15 0309 11/06/15 0631      Medications: Scheduled Meds: . ampicillin (OMNIPEN) IV  2 g Intravenous 4 times per day  . heparin  5,000 Units Subcutaneous 3 times per day  . oxyCODONE  10 mg Oral Q12H  . polyethylene glycol  17 g Oral Daily  . tamsulosin  0.4 mg Oral QPC supper   Continuous Infusions: . sodium chloride 75 mL/hr at 11/11/15 1614   PRN Meds:.acetaminophen, albuterol,  methocarbamol, ondansetron, oxyCODONE-acetaminophen    Objective: Weight change: 5 lb (2.268 kg)  Intake/Output Summary (Last 24 hours) at 11/12/15 2049 Last data filed at 11/12/15 1700  Gross per 24 hour  Intake 1427.5 ml  Output   3500 ml  Net -2072.5 ml   Blood pressure 117/73, pulse 75, temperature 98.1 F (36.7 C), temperature source Oral, resp. rate 18, height 6' 1.5" (1.867 m), weight 241 lb (109.317 kg), SpO2 98 %. Temp:  [97.8 F (36.6 C)-98.2 F (36.8 C)] 98.1 F (36.7 C) (02/27 1702) Pulse Rate:  [72-86] 75 (02/27 1702) Resp:  [18] 18 (02/27 1702) BP: (117-136)/(73-99) 117/73 mmHg (02/27 1702) SpO2:  [95 %-98 %] 98 % (02/27 1702)  Physical Exam: General: Alert and awake, oriented x3, not in any acute distress. HEENT: anicteric sclera, EOMI, oropharynx clear and without exudate Cardiovascular: egular rate, normal r, no murmur rubs or gallops Pulmonary: clear to auscultation bilaterally, no wheezing, rales or rhonchi Gastrointestinal: soft nontender, nondistended, normal bowel sounds, Musculoskeletal: left arm with warmth over incision Skin, soft tissue: no rashes Neuro: nonfocal  CBC:  CBC Latest Ref Rng 11/06/2015 09/26/2015 09/25/2014  WBC 4.0 - 10.5 K/uL 9.8 - 5.2  Hemoglobin 13.0 - 17.0  g/dL 16.1 09.6 04.5  Hematocrit 39.0 - 52.0 % 50.3 49.0 46.9  Platelets 150 - 400 K/uL 152 - 164       BMET No results for input(s): NA, K, CL, CO2, GLUCOSE, BUN, CREATININE, CALCIUM in the last 72 hours.   Liver Panel  No results for input(s): PROT, ALBUMIN, AST, ALT, ALKPHOS, BILITOT, BILIDIR, IBILI in the last 72 hours.     Sedimentation Rate No results for input(s): ESRSEDRATE in the last 72 hours. C-Reactive Protein No results for input(s): CRP in the last 72 hours.  Micro Results: Recent Results (from the past 720 hour(s))  Blood Culture (routine x 2)     Status: None   Collection Time: 11/06/15  3:24 AM  Result Value Ref Range Status   Specimen  Description BLOOD RIGHT ARM  Final   Special Requests BOTTLES DRAWN AEROBIC AND ANAEROBIC  Final   Culture  Setup Time   Final    GRAM POSITIVE COCCI IN CHAINS IN PAIRS IN BOTH AEROBIC AND ANAEROBIC BOTTLES CRITICAL RESULT CALLED TO, READ BACK BY AND VERIFIED WITH: Clifton James RN 2114 11/06/15 A BROWNING    Culture ENTEROCOCCUS SPECIES  Final   Report Status 11/09/2015 FINAL  Final   Organism ID, Bacteria ENTEROCOCCUS SPECIES  Final      Susceptibility   Enterococcus species - MIC*    AMPICILLIN <=2 SENSITIVE Sensitive     VANCOMYCIN 1 SENSITIVE Sensitive     GENTAMICIN SYNERGY SENSITIVE Sensitive     * ENTEROCOCCUS SPECIES  Blood Culture (routine x 2)     Status: None   Collection Time: 11/06/15  3:40 AM  Result Value Ref Range Status   Specimen Description BLOOD RIGHT HAND  Final   Special Requests BOTTLES DRAWN AEROBIC AND ANAEROBIC  Final   Culture  Setup Time   Final    GRAM POSITIVE COCCI IN CHAINS IN PAIRS IN BOTH AEROBIC AND ANAEROBIC BOTTLES CRITICAL RESULT CALLED TO, READ BACK BY AND VERIFIED WITH: Clifton James RN 2114 11/06/15 A BROWNING    Culture   Final    ENTEROCOCCUS SPECIES SUSCEPTIBILITIES PERFORMED ON PREVIOUS CULTURE WITHIN THE LAST 5 DAYS.    Report Status 11/09/2015 FINAL  Final  Urine culture     Status: None   Collection Time: 11/06/15  4:07 AM  Result Value Ref Range Status   Specimen Description URINE, RANDOM  Final   Special Requests NONE  Final   Culture >=100,000 COLONIES/mL ENTEROCOCCUS SPECIES  Final   Report Status 11/08/2015 FINAL  Final   Organism ID, Bacteria ENTEROCOCCUS SPECIES  Final      Susceptibility   Enterococcus species - MIC*    AMPICILLIN <=2 SENSITIVE Sensitive     LEVOFLOXACIN 0.5 SENSITIVE Sensitive     NITROFURANTOIN <=16 SENSITIVE Sensitive     VANCOMYCIN 1 SENSITIVE Sensitive     * >=100,000 COLONIES/mL ENTEROCOCCUS SPECIES  Culture, sputum-assessment     Status: None   Collection Time: 11/06/15  9:25 AM  Result  Value Ref Range Status   Specimen Description SPUTUM  Final   Special Requests NONE  Final   Sputum evaluation   Final    THIS SPECIMEN IS ACCEPTABLE. RESPIRATORY CULTURE REPORT TO FOLLOW.   Report Status 11/06/2015 FINAL  Final  Gram stain     Status: None   Collection Time: 11/06/15  9:26 AM  Result Value Ref Range Status   Specimen Description SPUTUM  Final   Special Requests NONE  Final   Gram Stain   Final    MODERATE WBC PRESENT, PREDOMINANTLY PMN MODERATE GRAM POSITIVE COCCI IN PAIRS FEW GRAM NEGATIVE RODS RARE GRAM POSITIVE RODS    Report Status 11/06/2015 FINAL  Final  Culture, respiratory (NON-Expectorated)     Status: None   Collection Time: 11/06/15  9:26 AM  Result Value Ref Range Status   Specimen Description SPUTUM  Final   Special Requests NONE  Final   Gram Stain   Final    MODERATE WBC PRESENT, PREDOMINANTLY PMN FEW SQUAMOUS EPITHELIAL CELLS PRESENT MODERATE GRAM POSITIVE COCCI IN PAIRS FEW GRAM NEGATIVE RODS RARE GRAM POSITIVE RODS Performed at [facility] THIS SPECIMEN IS ACCEPTABLE FOR SPUTUM CULTURE Performed at [facility]    Culture   Final    NORMAL OROPHARYNGEAL FLORA Performed at [facility]    Report Status 11/09/2015 FINAL  Final  Culture, blood (Routine X 2) w Reflex to ID Panel     Status: None (Preliminary result)   Collection Time: 11/08/15 11:57 AM  Result Value Ref Range Status   Specimen Description BLOOD LEFT HAND  Final   Special Requests BOTTLES DRAWN AEROBIC AND ANAEROBIC 5CC  Final   Culture NO GROWTH 4 DAYS  Final   Report Status PENDING  Incomplete  Culture, blood (Routine X 2) w Reflex to ID Panel     Status: None (Preliminary result)   Collection Time: 11/08/15 11:57 AM  Result Value Ref Range Status   Specimen Description BLOOD RIGHT HAND  Final   Special Requests BOTTLES DRAWN AEROBIC AND ANAEROBIC 5CC  Final   Culture NO GROWTH 4 DAYS  Final   Report Status PENDING  Incomplete     Studies/Results: No results found.    Assessment/Plan:  INTERVAL HISTORY:   TTE without vegeations   Principal Problem:   Urinary retention Active Problems:   Hypertension   History of DVT (deep vein thrombosis)-? 2012   CAP (community acquired pneumonia)   Transient complete heart block vs blocked PACs   Sepsis (HCC)   Poor dentition   Mobitz (type) I (Wenckebach's) atrioventricular block   Enterococcal bacteremia   Candida UTI   Acute cystitis without hematuria   Abdominal distension    Malik Mullins is a 60 y.o. male with  Enterococcal bacteremia likely from urinary source also with CKD and acute urinary retention  #1 Enterococcal bacteremia likely from urinary source:  --continue AMP --repeat blood cultures are incubating --obtain TEE to evaluate for endocarditis --if no endocarditis would treat with AMP alone for 2 weeks if endocarditis weigh AMP + gent vs AMP + high dose Ceftriaxone  #2 Pain near his hardware site: will get noncontrast CT of this area    LOS: 6 days   Malik Mullins 11/12/2015, 8:49 PM

## 2015-11-13 ENCOUNTER — Inpatient Hospital Stay (HOSPITAL_COMMUNITY): Payer: Medicare Other

## 2015-11-13 ENCOUNTER — Encounter (HOSPITAL_COMMUNITY): Payer: Self-pay | Admitting: *Deleted

## 2015-11-13 ENCOUNTER — Encounter (HOSPITAL_COMMUNITY)
Admission: EM | Disposition: A | Discharge status: Home Health | Payer: Self-pay | Source: Home / Self Care | Attending: Internal Medicine

## 2015-11-13 DIAGNOSIS — B952 Enterococcus as the cause of diseases classified elsewhere: Secondary | ICD-10-CM | POA: Diagnosis not present

## 2015-11-13 DIAGNOSIS — J188 Other pneumonia, unspecified organism: Secondary | ICD-10-CM | POA: Diagnosis not present

## 2015-11-13 DIAGNOSIS — A4181 Sepsis due to Enterococcus: Secondary | ICD-10-CM

## 2015-11-13 DIAGNOSIS — N183 Chronic kidney disease, stage 3 (moderate): Secondary | ICD-10-CM | POA: Diagnosis not present

## 2015-11-13 DIAGNOSIS — R7881 Bacteremia: Secondary | ICD-10-CM

## 2015-11-13 DIAGNOSIS — K700 Alcoholic fatty liver: Secondary | ICD-10-CM

## 2015-11-13 DIAGNOSIS — N3000 Acute cystitis without hematuria: Secondary | ICD-10-CM | POA: Diagnosis not present

## 2015-11-13 DIAGNOSIS — G4730 Sleep apnea, unspecified: Secondary | ICD-10-CM

## 2015-11-13 DIAGNOSIS — I441 Atrioventricular block, second degree: Secondary | ICD-10-CM | POA: Diagnosis not present

## 2015-11-13 DIAGNOSIS — I129 Hypertensive chronic kidney disease with stage 1 through stage 4 chronic kidney disease, or unspecified chronic kidney disease: Secondary | ICD-10-CM | POA: Diagnosis not present

## 2015-11-13 DIAGNOSIS — N1831 Chronic kidney disease, stage 3a: Secondary | ICD-10-CM | POA: Diagnosis present

## 2015-11-13 HISTORY — PX: TEE WITHOUT CARDIOVERSION: SHX5443

## 2015-11-13 LAB — CULTURE, BLOOD (ROUTINE X 2)
CULTURE: NO GROWTH
Culture: NO GROWTH

## 2015-11-13 SURGERY — ECHOCARDIOGRAM, TRANSESOPHAGEAL
Anesthesia: Moderate Sedation

## 2015-11-13 MED ORDER — OXYCODONE-ACETAMINOPHEN 5-325 MG PO TABS: 1.0000 | ORAL_TABLET | Freq: Three times a day (TID) | ORAL | Status: DC | PRN

## 2015-11-13 MED ORDER — FENTANYL CITRATE (PF) 100 MCG/2ML IJ SOLN
INTRAMUSCULAR | Status: AC
  Filled 2015-11-13: qty 2

## 2015-11-13 MED ORDER — DIPHENHYDRAMINE HCL 50 MG/ML IJ SOLN
INTRAMUSCULAR | Status: AC
  Filled 2015-11-13: qty 1

## 2015-11-13 MED ORDER — MIDAZOLAM HCL 10 MG/2ML IJ SOLN
INTRAMUSCULAR | Status: DC | PRN
  Administered 2015-11-13: 2 mg via INTRAVENOUS
  Administered 2015-11-13: 1 mg via INTRAVENOUS
  Administered 2015-11-13: 2 mg via INTRAVENOUS

## 2015-11-13 MED ORDER — FENTANYL CITRATE (PF) 100 MCG/2ML IJ SOLN
INTRAMUSCULAR | Status: DC | PRN
  Administered 2015-11-13: 25 ug via INTRAVENOUS

## 2015-11-13 MED ORDER — SODIUM CHLORIDE 0.9 % IV SOLN
INTRAVENOUS | Status: DC
Start: 2015-11-13 — End: 2015-11-13
  Administered 2015-11-13: 500 mL via INTRAVENOUS

## 2015-11-13 MED ORDER — SODIUM CHLORIDE 0.9 % IV SOLN: 2.0000 g | Freq: Four times a day (QID) | INTRAVENOUS | Status: AC

## 2015-11-13 MED ORDER — TAMSULOSIN HCL 0.4 MG PO CAPS: 0.4000 mg | ORAL_CAPSULE | Freq: Every day | ORAL | Status: DC

## 2015-11-13 MED ORDER — BUTAMBEN-TETRACAINE-BENZOCAINE 2-2-14 % EX AERO
INHALATION_SPRAY | CUTANEOUS | Status: DC | PRN
Start: 2015-11-13 — End: 2015-11-13
  Administered 2015-11-13: 2 via TOPICAL

## 2015-11-13 MED ORDER — HEPARIN SOD (PORK) LOCK FLUSH 100 UNIT/ML IV SOLN
250.0000 [IU] | INTRAVENOUS | Status: AC | PRN
  Administered 2015-11-13: 250 [IU]

## 2015-11-13 MED ORDER — MIDAZOLAM HCL 5 MG/ML IJ SOLN
INTRAMUSCULAR | Status: AC
  Filled 2015-11-13: qty 2

## 2015-11-13 MED ORDER — SODIUM CHLORIDE 0.9% FLUSH
10.0000 mL | INTRAVENOUS | Status: DC | PRN
  Administered 2015-11-13: 10 mL
  Filled 2015-11-13: qty 40

## 2015-11-13 NOTE — Discharge Instructions (Signed)
Sleep Apnea  Sleep apnea is a sleep disorder characterized by abnormal pauses in breathing while you sleep. When your breathing pauses, the level of oxygen in your blood decreases. This causes you to move out of deep sleep and into light sleep. As a result, your quality of sleep is poor, and the system that carries your blood throughout your body (cardiovascular system) experiences stress. If sleep apnea remains untreated, the following conditions can develop:  High blood pressure (hypertension).  Coronary artery disease.  Inability to achieve or maintain an erection (impotence).  Impairment of your thought process (cognitive dysfunction). There are three types of sleep apnea: 1. Obstructive sleep apnea--Pauses in breathing during sleep because of a blocked airway. 2. Central sleep apnea--Pauses in breathing during sleep because the area of the brain that controls your breathing does not send the correct signals to the muscles that control breathing. 3. Mixed sleep apnea--A combination of both obstructive and central sleep apnea. RISK FACTORS The following risk factors can increase your risk of developing sleep apnea:  Being overweight.  Smoking.  Having narrow passages in your nose and throat.  Being of older age.  Being male.  Alcohol use.  Sedative and tranquilizer use.  Ethnicity. Among individuals younger than 35 years, African Americans are at increased risk of sleep apnea. SYMPTOMS   Difficulty staying asleep.  Daytime sleepiness and fatigue.  Loss of energy.  Irritability.  Loud, heavy snoring.  Morning headaches.  Trouble concentrating.  Forgetfulness.  Decreased interest in sex.  Unexplained sleepiness. DIAGNOSIS  In order to diagnose sleep apnea, your caregiver will perform a physical examination. A sleep study done in the comfort of your own home may be appropriate if you are otherwise healthy. Your caregiver may also recommend that you spend the  night in a sleep lab. In the sleep lab, several monitors record information about your heart, lungs, and brain while you sleep. Your leg and arm movements and blood oxygen level are also recorded. TREATMENT The following actions may help to resolve mild sleep apnea:  Sleeping on your side.   Using a decongestant if you have nasal congestion.   Avoiding the use of depressants, including alcohol, sedatives, and narcotics.   Losing weight and modifying your diet if you are overweight. There also are devices and treatments to help open your airway:  Oral appliances. These are custom-made mouthpieces that shift your lower jaw forward and slightly open your bite. This opens your airway.  Devices that create positive airway pressure. This positive pressure "splints" your airway open to help you breathe better during sleep. The following devices create positive airway pressure:  Continuous positive airway pressure (CPAP) device. The CPAP device creates a continuous level of air pressure with an air pump. The air is delivered to your airway through a mask while you sleep. This continuous pressure keeps your airway open.  Nasal expiratory positive airway pressure (EPAP) device. The EPAP device creates positive air pressure as you exhale. The device consists of single-use valves, which are inserted into each nostril and held in place by adhesive. The valves create very little resistance when you inhale but create much more resistance when you exhale. That increased resistance creates the positive airway pressure. This positive pressure while you exhale keeps your airway open, making it easier to breath when you inhale again.  Bilevel positive airway pressure (BPAP) device. The BPAP device is used mainly in patients with central sleep apnea. This device is similar to the CPAP device because   it also uses an air pump to deliver continuous air pressure through a mask. However, with the BPAP machine, the  pressure is set at two different levels. The pressure when you exhale is lower than the pressure when you inhale.  Surgery. Typically, surgery is only done if you cannot comply with less invasive treatments or if the less invasive treatments do not improve your condition. Surgery involves removing excess tissue in your airway to create a wider passage way.   This information is not intended to replace advice given to you by your health care provider. Make sure you discuss any questions you have with your health care provider.   Document Released: 08/22/2002 Document Revised: 09/22/2014 Document Reviewed: 01/08/2012 [employer] Interactive Patient Education 2016 [employer]   

## 2015-11-13 NOTE — Progress Notes (Signed)
Subjective:  C/o left forearm pain   Antibiotics:  Anti-infectives    Start     Dose/Rate Route Frequency Ordered Stop   11/13/15 0000  ampicillin 2 g in sodium chloride 0.9 % 50 mL     2 g 150 mL/hr over 20 Minutes Intravenous Every 6 hours 11/13/15 1055 11/19/15 2359   11/08/15 1200  ampicillin (OMNIPEN) 2 g in sodium chloride 0.9 % 50 mL IVPB     2 g 150 mL/hr over 20 Minutes Intravenous 4 times per day 11/08/15 1130     11/07/15 1000  vancomycin (VANCOCIN) 1,250 mg in sodium chloride 0.9 % 250 mL IVPB  Status:  Discontinued     1,250 mg 166.7 mL/hr over 90 Minutes Intravenous Every 24 hours 11/07/15 0919 11/08/15 1128   11/07/15 0930  piperacillin-tazobactam (ZOSYN) IVPB 3.375 g  Status:  Discontinued     3.375 g 12.5 mL/hr over 240 Minutes Intravenous 3 times per day 11/07/15 0919 11/08/15 1128   11/06/15 0545  cefTRIAXone (ROCEPHIN) 1 g in dextrose 5 % 50 mL IVPB  Status:  Discontinued     1 g 100 mL/hr over 30 Minutes Intravenous Every 24 hours 11/06/15 0535 11/07/15 0912   11/06/15 0545  azithromycin (ZITHROMAX) 500 mg in dextrose 5 % 250 mL IVPB  Status:  Discontinued     500 mg 250 mL/hr over 60 Minutes Intravenous Every 24 hours 11/06/15 0535 11/07/15 0912   11/06/15 0315  piperacillin-tazobactam (ZOSYN) IVPB 3.375 g     3.375 g 100 mL/hr over 30 Minutes Intravenous  Once 11/06/15 0301 11/06/15 0552   11/06/15 0315  vancomycin (VANCOCIN) IVPB 1000 mg/200 mL premix  Status:  Discontinued     1,000 mg 200 mL/hr over 60 Minutes Intravenous  Once 11/06/15 0301 11/06/15 0309   11/06/15 0315  vancomycin (VANCOCIN) 1,750 mg in sodium chloride 0.9 % 500 mL IVPB     1,750 mg 250 mL/hr over 120 Minutes Intravenous  Once 11/06/15 0309 11/06/15 0631      Medications: Scheduled Meds: . ampicillin (OMNIPEN) IV  2 g Intravenous 4 times per day  . heparin  5,000 Units Subcutaneous 3 times per day  . oxyCODONE  10 mg Oral Q12H  . polyethylene glycol  17 g Oral Daily   . tamsulosin  0.4 mg Oral QPC supper   Continuous Infusions: . sodium chloride 75 mL/hr at 11/11/15 1614   PRN Meds:.acetaminophen, albuterol, methocarbamol, ondansetron, oxyCODONE-acetaminophen, sodium chloride flush    Objective: Weight change:   Intake/Output Summary (Last 24 hours) at 11/13/15 1419 Last data filed at 11/13/15 1000  Gross per 24 hour  Intake    820 ml  Output   3025 ml  Net  -2205 ml   Blood pressure 108/65, pulse 72, temperature 97.8 F (36.6 C), temperature source Oral, resp. rate 14, height 6' 1.5" (1.867 m), weight 241 lb (109.317 kg), SpO2 94 %. Temp:  [97.8 F (36.6 C)-98.2 F (36.8 C)] 97.8 F (36.6 C) (02/28 0942) Pulse Rate:  [64-76] 72 (02/28 0950) Resp:  [12-21] 14 (02/28 0950) BP: (105-152)/(63-90) 108/65 mmHg (02/28 0950) SpO2:  [94 %-100 %] 94 % (02/28 0950)  Physical Exam: General: Alert and awake, oriented x3, not in any acute distress. HEENT: anicteric sclera, EOMI, oropharynx clear and without exudate Cardiovascular: egular rate, normal r, no murmur rubs or gallops Pulmonary: clear to auscultation bilaterally, no wheezing, rales or rhonchi Gastrointestinal: soft nontender, nondistended, normal bowel sounds,  Musculoskeletal: left arm with warmth over incision Skin, soft tissue: no rashes Neuro: nonfocal  CBC:  CBC Latest Ref Rng 11/06/2015 09/26/2015 09/25/2014  WBC 4.0 - 10.5 K/uL 9.8 - 5.2  Hemoglobin 13.0 - 17.0 g/dL 16.1 09.6 04.5  Hematocrit 39.0 - 52.0 % 50.3 49.0 46.9  Platelets 150 - 400 K/uL 152 - 164       BMET No results for input(s): NA, K, CL, CO2, GLUCOSE, BUN, CREATININE, CALCIUM in the last 72 hours.   Liver Panel  No results for input(s): PROT, ALBUMIN, AST, ALT, ALKPHOS, BILITOT, BILIDIR, IBILI in the last 72 hours.     Sedimentation Rate No results for input(s): ESRSEDRATE in the last 72 hours. C-Reactive Protein No results for input(s): CRP in the last 72 hours.  Micro Results: Recent  Results (from the past 720 hour(s))  Blood Culture (routine x 2)     Status: None   Collection Time: 11/06/15  3:24 AM  Result Value Ref Range Status   Specimen Description BLOOD RIGHT ARM  Final   Special Requests BOTTLES DRAWN AEROBIC AND ANAEROBIC  Final   Culture  Setup Time   Final    GRAM POSITIVE COCCI IN CHAINS IN PAIRS IN BOTH AEROBIC AND ANAEROBIC BOTTLES CRITICAL RESULT CALLED TO, READ BACK BY AND VERIFIED WITH: Clifton James RN 2114 11/06/15 A BROWNING    Culture ENTEROCOCCUS SPECIES  Final   Report Status 11/09/2015 FINAL  Final   Organism ID, Bacteria ENTEROCOCCUS SPECIES  Final      Susceptibility   Enterococcus species - MIC*    AMPICILLIN <=2 SENSITIVE Sensitive     VANCOMYCIN 1 SENSITIVE Sensitive     GENTAMICIN SYNERGY SENSITIVE Sensitive     * ENTEROCOCCUS SPECIES  Blood Culture (routine x 2)     Status: None   Collection Time: 11/06/15  3:40 AM  Result Value Ref Range Status   Specimen Description BLOOD RIGHT HAND  Final   Special Requests BOTTLES DRAWN AEROBIC AND ANAEROBIC  Final   Culture  Setup Time   Final    GRAM POSITIVE COCCI IN CHAINS IN PAIRS IN BOTH AEROBIC AND ANAEROBIC BOTTLES CRITICAL RESULT CALLED TO, READ BACK BY AND VERIFIED WITH: Clifton James RN 2114 11/06/15 A BROWNING    Culture   Final    ENTEROCOCCUS SPECIES SUSCEPTIBILITIES PERFORMED ON PREVIOUS CULTURE WITHIN THE LAST 5 DAYS.    Report Status 11/09/2015 FINAL  Final  Urine culture     Status: None   Collection Time: 11/06/15  4:07 AM  Result Value Ref Range Status   Specimen Description URINE, RANDOM  Final   Special Requests NONE  Final   Culture >=100,000 COLONIES/mL ENTEROCOCCUS SPECIES  Final   Report Status 11/08/2015 FINAL  Final   Organism ID, Bacteria ENTEROCOCCUS SPECIES  Final      Susceptibility   Enterococcus species - MIC*    AMPICILLIN <=2 SENSITIVE Sensitive     LEVOFLOXACIN 0.5 SENSITIVE Sensitive     NITROFURANTOIN <=16 SENSITIVE Sensitive     VANCOMYCIN 1  SENSITIVE Sensitive     * >=100,000 COLONIES/mL ENTEROCOCCUS SPECIES  Culture, sputum-assessment     Status: None   Collection Time: 11/06/15  9:25 AM  Result Value Ref Range Status   Specimen Description SPUTUM  Final   Special Requests NONE  Final   Sputum evaluation   Final    THIS SPECIMEN IS ACCEPTABLE. RESPIRATORY CULTURE REPORT TO FOLLOW.   Report Status 11/06/2015 FINAL  Final  Gram stain     Status: None   Collection Time: 11/06/15  9:26 AM  Result Value Ref Range Status   Specimen Description SPUTUM  Final   Special Requests NONE  Final   Gram Stain   Final    MODERATE WBC PRESENT, PREDOMINANTLY PMN MODERATE GRAM POSITIVE COCCI IN PAIRS FEW GRAM NEGATIVE RODS RARE GRAM POSITIVE RODS    Report Status 11/06/2015 FINAL  Final  Culture, respiratory (NON-Expectorated)     Status: None   Collection Time: 11/06/15  9:26 AM  Result Value Ref Range Status   Specimen Description SPUTUM  Final   Special Requests NONE  Final   Gram Stain   Final    MODERATE WBC PRESENT, PREDOMINANTLY PMN FEW SQUAMOUS EPITHELIAL CELLS PRESENT MODERATE GRAM POSITIVE COCCI IN PAIRS FEW GRAM NEGATIVE RODS RARE GRAM POSITIVE RODS Performed at [facility] THIS SPECIMEN IS ACCEPTABLE FOR SPUTUM CULTURE Performed at [facility]    Culture   Final    NORMAL OROPHARYNGEAL FLORA Performed at [facility]    Report Status 11/09/2015 FINAL  Final  Culture, blood (Routine X 2) w Reflex to ID Panel     Status: None (Preliminary result)   Collection Time: 11/08/15 11:57 AM  Result Value Ref Range Status   Specimen Description BLOOD LEFT HAND  Final   Special Requests BOTTLES DRAWN AEROBIC AND ANAEROBIC 5CC  Final   Culture NO GROWTH 4 DAYS  Final   Report Status PENDING  Incomplete  Culture, blood (Routine X 2) w Reflex to ID Panel     Status: None (Preliminary result)   Collection Time: 11/08/15 11:57 AM  Result Value Ref Range Status   Specimen Description BLOOD RIGHT HAND   Final   Special Requests BOTTLES DRAWN AEROBIC AND ANAEROBIC 5CC  Final   Culture NO GROWTH 4 DAYS  Final   Report Status PENDING  Incomplete    Studies/Results: Ct Elbow Left Wo Contrast  11/13/2015  CLINICAL DATA:  Left elbow pain on going after surgery 2 weeks ago. Oblique fracture of the olecranon base. EXAM: CT OF THE LEFT ELBOW WITHOUT CONTRAST TECHNIQUE: Multidetector CT imaging was performed according to the standard protocol. Multiplanar CT image reconstructions were also generated. COMPARISON:  09/24/2015. FINDINGS: Olecranon plate and screw fixation in place with near-anatomic alignment at the oblique fracture site. The fixator S2 long proximal screws through the it proximal end of the plate which traverse the fracture, as well as multiple transverse screws through the proximal ulna holding the plate in place to traverse the fracture. No hardware failure, new fracture, or specific complicating feature of the plate and screw fixator is identified. The fracture of the proximal ulna is noted to extend into the proximal radial ulnar articulation. There is some spurring of the olecranon and of the distal humerus. Radio capitellar alignment normal. There is a small anterior fracture of the radial head, nondisplaced, images 38- 40 of series 7. The distal biceps tendon appears intact. Triceps tendon edematous and expanded but I do not see obvious discontinuity. The IMPRESSION: 1. Plate and screw fixator of the proximal ulnar fracture is well positioned and without complicating feature. The fracture extends into the proximal ulnar articular surface along the olecranon, and also into the articular surface of the proximal radial ulnar articulation. Near-anatomic alignment. 2. Small nondisplaced anterior fracture of the radial head. 3. Edematous distal triceps tendon, not unexpected given the adjacent plate. Electronically Signed   By: Zollie Beckers  Ova Freshwater M.D.   On: 11/13/2015 08:07       Assessment/Plan:  INTERVAL HISTORY:   TTE without vegeations   Principal Problem:   Enterococcal bacteremia Active Problems:   Hypertension   CAP (community acquired pneumonia)   Transient complete heart block vs blocked PACs   Sepsis (HCC)   Bilateral pneumonia   Urinary retention   Mobitz (type) I (Wenckebach's) atrioventricular block   Candida UTI   Acute cystitis without hematuria   Abdominal distension   Arm pain   Sleep apnea   Alcoholic fatty liver   Chronic kidney disease (CKD) stage G3a/A1, moderately decreased glomerular filtration rate (GFR) between 45-59 mL/min/1.73 square meter and albuminuria creatinine ratio less than 30 mg/g    Malik Mullins is a 61 y.o. male with  Enterococcal bacteremia likely from urinary source also with CKD and acute urinary retention  #1 Enterococcal bacteremia likely from urinary source:  --continue AMP --repeat blood cultures are no growth x 4 days --TEE without endocarditis  Therefore treat with AMP alone for 2 weeks total, pull PICC at completion of therapy  Diagnosis: AMP S enterococcal bacteremia due to UTI  Culture Result: AMP S enterococcus  Allergies  Allergen Reactions  . Lisinopril Swelling  . Tramadol Nausea And Vomiting    Discharge antibiotics: AMPICILLIN  Per pharmacy protocol AMPICILLIN  Duration:  Total 2 weeks inpatient and outpatient  End Date:  11/21/15  [facility] Care Per Protocol:  Labs weekly while on IV antibiotics: _x_ CBC with differential x__ CMP   Fax weekly labs to [phone]  Clinic Follow Up Appt:  If needed in 3 weeks.     #2 Pain near his hardware site: CT reassuring   I will sign off for now    LOS: 7 days   Acey Lav 11/13/2015, 2:19 PM

## 2015-11-13 NOTE — CV Procedure (Signed)
During this procedure the patient is administered a total of Versed 5 mg and Fentanyl 25 mg to achieve and maintain moderate conscious sedation.  The patient's heart rate, blood pressure, and oxygen saturation are monitored continuously during the procedure. The period of conscious sedation is 33 minutes, of which I was present face-to-face 100% of this time.  Normal EF 60% Mildly dilated sinus of valsalva 4.3 cm Normal AV Mild MR Mild TR Mild PR No LAA thrombus No PFO/ASD Normal RV Normal aorta No SBE or vegetations  Charlton Haws

## 2015-11-13 NOTE — Interval H&P Note (Signed)
History and Physical Interval Note:  11/13/2015 8:46 AM  Malik Mullins  has presented today for surgery, with the diagnosis of BACTEREMIA  The various methods of treatment have been discussed with the patient and family. After consideration of risks, benefits and other options for treatment, the patient has consented to  Procedure(s): TRANSESOPHAGEAL ECHOCARDIOGRAM (TEE) (N/A) as a surgical intervention .  The patient's history has been reviewed, patient examined, no change in status, stable for surgery.  I have reviewed the patient's chart and labs.  Questions were answered to the patient's satisfaction.     Charlton Haws

## 2015-11-13 NOTE — Progress Notes (Signed)
Patient going for a TEE this morning. Consent not yet signed. Patient states he needs more information about the TEE before signing the consent. RN will inform Public relations account executive.   Veatrice Kells, RN

## 2015-11-13 NOTE — Progress Notes (Signed)
Echocardiogram 2D Echocardiogram has been performed.  Dorothey Baseman 11/13/2015, 9:47 AM

## 2015-11-13 NOTE — H&P (View-Only) (Signed)
Date of Admission:  11/06/2015  Date of Consult:  11/08/2015  Reason for Consult: Enterococcal bacteremia Referring Physician: "Auto-consult", Dr. Clementeen Graham   HPI: Malik Mullins is an 60 y.o. male with PMHX significant  For DVT's, HTN, CKD presented to ED after unable to urinate at all. Foley was placed in ED and upon exam he was found to be febrile with chills and hypoxemia.  CXR showed bibasilar infiltrates. UA with pyuria. He was started on vancomycin and zosyn. Urine cultures yielded AMP S enterococcus and blood cultures have yielded 2/2 Enterococcus S pending     Past Medical History  Diagnosis Date  . Hypertension   . Back pain   . DVT (deep venous thrombosis) (HCC)     left leg and right leg  . Knee joint pain   . Pneumonia     Past Surgical History  Procedure Laterality Date  . Mandible surgery    . Orif elbow fracture Left 09/26/2015    Procedure: OPEN REDUCTION INTERNAL FIXATION (ORIF) LEFT OLECRANON FRACTURE;  Surgeon: Leandrew Koyanagi, MD;  Location: [facility];  Service: Orthopedics;  Laterality: Left;    Social History:  reports that he has been smoking Cigarettes.  He has a 12 pack-year smoking history. He has never used smokeless tobacco. He reports that he drinks alcohol. He reports that he does not use illicit drugs.   Family History  Problem Relation Age of Onset  . Hypertension Mother   . Diabetes Father     Allergies  Allergen Reactions  . Lisinopril Swelling  . Tramadol Nausea And Vomiting     Medications: I have reviewed patients current medications as documented in Epic Anti-infectives    Start     Dose/Rate Route Frequency Ordered Stop   11/08/15 1200  ampicillin (OMNIPEN) 2 g in sodium chloride 0.9 % 50 mL IVPB     2 g 150 mL/hr over 20 Minutes Intravenous 4 times per day 11/08/15 1130     11/07/15 1000  vancomycin (VANCOCIN) 1,250 mg in sodium chloride 0.9 % 250 mL IVPB  Status:  Discontinued     1,250 mg 166.7  mL/hr over 90 Minutes Intravenous Every 24 hours 11/07/15 0919 11/08/15 1128   11/07/15 0930  piperacillin-tazobactam (ZOSYN) IVPB 3.375 g  Status:  Discontinued     3.375 g 12.5 mL/hr over 240 Minutes Intravenous 3 times per day 11/07/15 0919 11/08/15 1128   11/06/15 0545  cefTRIAXone (ROCEPHIN) 1 g in dextrose 5 % 50 mL IVPB  Status:  Discontinued     1 g 100 mL/hr over 30 Minutes Intravenous Every 24 hours 11/06/15 0535 11/07/15 0912   11/06/15 0545  azithromycin (ZITHROMAX) 500 mg in dextrose 5 % 250 mL IVPB  Status:  Discontinued     500 mg 250 mL/hr over 60 Minutes Intravenous Every 24 hours 11/06/15 0535 11/07/15 0912   11/06/15 0315  piperacillin-tazobactam (ZOSYN) IVPB 3.375 g     3.375 g 100 mL/hr over 30 Minutes Intravenous  Once 11/06/15 0301 11/06/15 0552   11/06/15 0315  vancomycin (VANCOCIN) IVPB 1000 mg/200 mL premix  Status:  Discontinued     1,000 mg 200 mL/hr over 60 Minutes Intravenous  Once 11/06/15 0301 11/06/15 0309   11/06/15 0315  vancomycin (VANCOCIN) 1,750 mg in sodium chloride 0.9 % 500 mL IVPB     1,750 mg 250 mL/hr over 120 Minutes Intravenous  Once 11/06/15 0309 11/06/15 0631  ROS: as in HPI otherwise remainder of 12 point Review of Systems is negative   Blood pressure 130/78, pulse 72, temperature 98 F (36.7 C), temperature source Oral, resp. rate 18, height 6' 1.5" (1.867 m), weight 241 lb (109.317 kg), SpO2 98 %. General: Alert and awake, oriented x3, not in any acute distress. HEENT: anicteric sclera,  EOMI, oropharynx clear and without exudate Cardiovascular: egular rate, normal r,  no murmur rubs or gallops Pulmonary: clear to auscultation bilaterally, no wheezing, rales or rhonchi Gastrointestinal: soft nontender, nondistended, normal bowel sounds, Musculoskeletal: no  clubbing or edema noted bilaterally Skin, soft tissue: no rashes Neuro: nonfocal, strength and sensation intact   Results for orders placed or performed during the  hospital encounter of 11/06/15 (from the past 48 hour(s))  Glucose, capillary     Status: Abnormal   Collection Time: 11/07/15  7:45 AM  Result Value Ref Range   Glucose-Capillary 120 (H) 65 - 99 mg/dL  Basic metabolic panel     Status: Abnormal   Collection Time: 11/07/15  7:00 PM  Result Value Ref Range   Sodium 139 135 - 145 mmol/L   Potassium 3.7 3.5 - 5.1 mmol/L   Chloride 106 101 - 111 mmol/L   CO2 21 (L) 22 - 32 mmol/L   Glucose, Bld 143 (H) 65 - 99 mg/dL   BUN 12 6 - 20 mg/dL   Creatinine, Ser 1.80 (H) 0.61 - 1.24 mg/dL   Calcium 8.6 (L) 8.9 - 10.3 mg/dL   GFR calc non Af Amer 39 (L) >60 mL/min   GFR calc Af Amer 45 (L) >60 mL/min    Comment: (NOTE) The eGFR has been calculated using the CKD EPI equation. This calculation has not been validated in all clinical situations. eGFR's persistently <60 mL/min signify possible Chronic Kidney Disease.    Anion gap 12 5 - 15  Magnesium     Status: None   Collection Time: 11/07/15  7:00 PM  Result Value Ref Range   Magnesium 1.7 1.7 - 2.4 mg/dL    ) Recent Results (from the past 720 hour(s))  Blood Culture (routine x 2)     Status: None (Preliminary result)   Collection Time: 11/06/15  3:24 AM  Result Value Ref Range Status   Specimen Description BLOOD RIGHT ARM  Final   Special Requests BOTTLES DRAWN AEROBIC AND ANAEROBIC 5ML  Final   Culture  Setup Time   Final    GRAM POSITIVE COCCI IN CHAINS IN PAIRS IN BOTH AEROBIC AND ANAEROBIC BOTTLES CRITICAL RESULT CALLED TO, READ BACK BY AND VERIFIED WITH: Van Clines RN 2114 11/06/15 A BROWNING    Culture ENTEROCOCCUS SPECIES SUSCEPTIBILITIES TO FOLLOW   Final   Report Status PENDING  Incomplete  Blood Culture (routine x 2)     Status: None (Preliminary result)   Collection Time: 11/06/15  3:40 AM  Result Value Ref Range Status   Specimen Description BLOOD RIGHT HAND  Final   Special Requests BOTTLES DRAWN AEROBIC AND ANAEROBIC 5ML  Final   Culture  Setup Time   Final     GRAM POSITIVE COCCI IN CHAINS IN PAIRS IN BOTH AEROBIC AND ANAEROBIC BOTTLES CRITICAL RESULT CALLED TO, READ BACK BY AND VERIFIED WITH: Van Clines RN 2114 11/06/15 A BROWNING    Culture ENTEROCOCCUS SPECIES SUSCEPTIBILITIES TO FOLLOW   Final   Report Status PENDING  Incomplete  Urine culture     Status: None   Collection Time: 11/06/15  4:07 AM  Result Value Ref Range Status   Specimen Description URINE, RANDOM  Final   Special Requests NONE  Final   Culture >=100,000 COLONIES/mL ENTEROCOCCUS SPECIES  Final   Report Status 11/08/2015 FINAL  Final   Organism ID, Bacteria ENTEROCOCCUS SPECIES  Final      Susceptibility   Enterococcus species - MIC*    AMPICILLIN <=2 SENSITIVE Sensitive     LEVOFLOXACIN 0.5 SENSITIVE Sensitive     NITROFURANTOIN <=16 SENSITIVE Sensitive     VANCOMYCIN 1 SENSITIVE Sensitive     * >=100,000 COLONIES/mL ENTEROCOCCUS SPECIES  Culture, sputum-assessment     Status: None   Collection Time: 11/06/15  9:25 AM  Result Value Ref Range Status   Specimen Description SPUTUM  Final   Special Requests NONE  Final   Sputum evaluation   Final    THIS SPECIMEN IS ACCEPTABLE. RESPIRATORY CULTURE REPORT TO FOLLOW.   Report Status 11/06/2015 FINAL  Final  Gram stain     Status: None   Collection Time: 11/06/15  9:26 AM  Result Value Ref Range Status   Specimen Description SPUTUM  Final   Special Requests NONE  Final   Gram Stain   Final    MODERATE WBC PRESENT, PREDOMINANTLY PMN MODERATE GRAM POSITIVE COCCI IN PAIRS FEW GRAM NEGATIVE RODS RARE GRAM POSITIVE RODS    Report Status 11/06/2015 FINAL  Final  Culture, respiratory (NON-Expectorated)     Status: None (Preliminary result)   Collection Time: 11/06/15  9:26 AM  Result Value Ref Range Status   Specimen Description SPUTUM  Final   Special Requests NONE  Final   Gram Stain   Final    MODERATE WBC PRESENT, PREDOMINANTLY PMN FEW SQUAMOUS EPITHELIAL CELLS PRESENT MODERATE GRAM POSITIVE COCCI IN PAIRS FEW  GRAM NEGATIVE RODS RARE GRAM POSITIVE RODS Performed at [place] Performed at [facility]    Culture   Final    Culture reincubated for better growth Performed at [facility]    Report Status PENDING  Incomplete     Impression/Recommendation  Principal Problem:   Urinary retention Active Problems:   Hypertension   History of DVT (deep vein thrombosis)-? 2012   CAP (community acquired pneumonia)   Transient complete heart block vs blocked PACs   Sepsis ([place])   Poor dentition   Mobitz (type) I (Wenckebach's) atrioventricular block   Malik Mullins is a 60 y.o. male with  Enterococcal bacteremia likely from urinary source also with CKD and acute urinary retention  #1 Enterococcal bacteremia likely from urinary source:  --narrow to AMP --repeat blood cultures --check TTE and if negative for vegeations would get TEE to evaluate for endocarditis --if no endocarditis would treat with AMP alone for 2 weeks if endocarditis weigh AMP + gent vs AMP + high dose Ceftriaxone  #2 Screening: screen for  HCV   #3 Hypoxemia: ? Due to pulmonary edema, doubt pneumonia  I spent greater than 60 minutes with the patient including greater than 50% of time in face to face counsel of the patient re his enterococcal bacteremia, urinary retention and and in coordination of his care.     11/08/2015, 7:13 PM   Thank you so much for this interesting consult  [place] [facility] [phone] (pager) [phone] (office) 11/08/2015, 7:13 PM  [place] 11/08/2015, 7:13 PM

## 2015-11-13 NOTE — Care Management Note (Signed)
Case Management Note  Patient Details  Name: Malik Mullins MRN: [id number] Date of Birth: 08/27/1956  Subjective/Objective:                 CM following for progression and d/c planning.  Action/Plan: 11/13/2015 Met with pt and significant other last week to plan for HH needs. Pt will d/[facility] to [facility] , address provided to [facility]. Pt ready for d/c today, [facility] notified and working with pt re IV antibiotics to be administered at home . Currently arranging for d/c to [facility] as noted previously.   Expected Discharge Date:   11/13/2015               Expected Discharge Plan:  Home w Home Health Services  In-House Referral:  NA  Discharge planning Services  CM Consult  Post Acute Care Choice:  Durable Medical Equipment Choice offered to:  Patient  DME Arranged:  IV pump/equipment DME Agency:  [facility]  HH Arranged:  RN HH Agency:  [facility]  Status of Service:  Completed, signed off  Medicare Important Message Given:  Yes Date Medicare IM Given:    Medicare IM give by:    Date Additional Medicare IM Given:    Additional Medicare Important Message give by:     If discussed at Long Length of Stay Meetings, dates discussed:    Additional Comments:  ,  U, RN 11/13/2015, 1:57 PM  

## 2015-11-13 NOTE — Progress Notes (Signed)
Nsg Discharge Note  Admit Date:  11/06/2015 Discharge date: 11/13/2015   Malik Mullins to be D/C'd home with home health per MD order.  AVS completed.  Copy for chart, and copy for patient signed, and dated. Patient able to verbalize understanding. No questions or concerns voiced with asked.   Discharge Medication:   Medication List    STOP taking these medications        oxyCODONE 10 mg 12 hr tablet  Commonly known as:  OXYCONTIN      TAKE these medications        albuterol 108 (90 Base) MCG/ACT inhaler  Commonly known as:  PROVENTIL HFA;VENTOLIN HFA  Inhale 2 puffs into the lungs every 6 (six) hours as needed for wheezing or shortness of breath.     ampicillin 2 g in sodium chloride 0.9 % 50 mL  Inject 2 g into the vein every 6 (six) hours.     hydrALAZINE 10 MG tablet  Commonly known as:  APRESOLINE  Take 20 mg by mouth 2 (two) times daily.     methocarbamol 750 MG tablet  Commonly known as:  ROBAXIN  Take 1 tablet (750 mg total) by mouth 2 (two) times daily as needed for muscle spasms.     multivitamin with minerals Tabs tablet  Take 1 tablet by mouth every morning.     ondansetron 4 MG tablet  Commonly known as:  ZOFRAN  Take 1-2 tablets (4-8 mg total) by mouth every 8 (eight) hours as needed for nausea or vomiting.     oxyCODONE-acetaminophen 5-325 MG tablet  Commonly known as:  PERCOCET  Take 1-2 tablets by mouth every 8 (eight) hours as needed for severe pain.     tamsulosin 0.4 MG Caps capsule  Commonly known as:  FLOMAX  Take 1 capsule (0.4 mg total) by mouth daily after supper.        Discharge Assessment: Filed Vitals:   11/13/15 0942 11/13/15 0950  BP: 106/66 108/65  Pulse:  72  Temp: 97.8 F (36.6 C)   Resp: 21 14   Skin clean, dry and intact without evidence of skin break down, no evidence of skin tears noted. IV catheter discontinued with catheter tip intact. Site without signs and symptoms of complications - no redness or edema noted at  insertion site, patient denies c/o pain - only slight tenderness at site.  Dressing with slight pressure applied. PICC line flushed by IV Team.   D/c Instructions-Education: Discharge instructions given to patient with verbalized understanding. D/c education completed with patient including follow up instructions, medication list, d/c activities limitations if indicated, with other d/c instructions as indicated by MD - patient able to verbalize understanding, all questions fully answered. Patient instructed to return to ED, call 911, or call MD for any changes in condition.  RN escorted pt to Emergency entrance. Pt insisted on Ambulating. Belongings were packed and RN assisted pt carrying items to his car.   Tobin Chad, RN 11/13/2015 2:58 PM

## 2015-11-13 NOTE — Discharge Summary (Signed)
Physician Discharge Summary  Malik Mullins RUE:[id number] DOB: [birth year] DOA: 11/06/2015  PCP: Thomes Dinning at [facility] Admit date: 11/06/2015 Discharge date: 11/13/2015  Time spent:  35 minutes  Recommendations for Outpatient Follow-up:  Discharge home on ampicillin 2 g every 6 hours until 11/19/2015. Please remove PICC line after antibiotics completed. Follow-up with PCP at [facility]. Recommend outpatient referral to sleep study and pulmonary consult for sleep apnea. Follow-up with EP in 4 weeks. Follow-up with Dr. Lajoyce Corners in 1 week for his left elbow pain.  Discharge Diagnoses:  Principal Problem:   Enterococcal bacteremia   Active Problems:   Hypertension   CAP (community acquired pneumonia)   Transient complete heart block vs blocked PACs   Sepsis (HCC)   Urinary retention   Poor dentition   Mobitz (type) I (Wenckebach's) atrioventricular block   Candida UTI   Acute cystitis without hematuria   Abdominal distension   Arm pain   Sleep apnea   Alcoholic fatty liver   BPH   obstructive sleep apnea   Discharge Condition: fair  Diet recommendation: low sodium  CODE STATUS: Full code   [facility] Weights   11/10/15 0519 11/10/15 2042 11/11/15 2023  Weight: 109.725 kg (241 lb 14.4 oz) 107.049 kg (236 lb) 109.317 kg (241 lb)    History of present illness:  60 year old male with history of hypertension, bilateral lower leg DVT, who presented to the ED initially for urinary retention which was relieved after a Foley was placed in. In the ED he was found to have fever with chills (temperature 100.3 Fahrenheit) and decided to the 70s on room air. He also complained of having cough with greenish sputum. Gestational in the ED showed bibasilar infiltrate concerning for pneumonia and was admitted to hospitalist service. Blood and urine cultures growing enterococcus which is mostly sensitive.  Hospital Course:  Acute hypoxic respiratory failure with bilateral lobar pneumonia with  enterococcus bacteremia Blood cultures on admission growing enterococcus in both bottles, likely from urinary source . Sensitive to ampicillin and antibiotics changed.  -2-D echo negative for vegetation. TEE done is negative for vegetation as well. Normal EF of 60% no thrombus, mild MR, TR and PR. - Remained afebrile and asymptomatic. -Repeat blood cultures negative for growth. -Appreciate ID consult recommendation. Plan to treat him with total 2 weeks of antibiotics with IV ampicillin. Patient will have a PICC line placed prior to discharge.   Acute urinary retention with UTI Cultures growing enterococcus. Sensitive to ampicillin.Marland Kitchen Possibly due to enlarged prostate with bladder outlet obstruction. Started on Flomax. Foley removed on 2/25 and voiding well. Recommend outpatient urology referral if symptoms recur so.  Sinus arrhythmia with type I second-degree AV block Heart rate dropped down to 30s on telemetry (mostly within asleep). Now has Mobitz type I and 2 with 2:1 AV block. In by EP again considering pacemaker, but needs to have his current infection resolved. Will follow-up as outpatient.  On discussion with his wife patient does snore heavily and possibly has symptoms of sleep apnea. This was recommended by EP in 2014 when he was admitted for syncopal symptoms. He would need outpatient sleep study.   Elevated lactic acid On admission. resolved with IV fluids.  CKD stage 3 A GFR of 50 Renal function at baseline.  Constipation Improved with MiraLAX.  Abdominal fullness with distention  Abdominal ultrasound suggests gaseous distention and fatty liver. Wife reports that patient drinks 40 ounces of beer every day. Counseled on cessation.   Left elbow pain. Has plate  and screw fixator of the proximal ulnar fracture which was seen unchanged on CT of the elbow done on 2/28. There is a small nondisplaced anterior fracture of the radial head. Has good range of motion of the forearm and  wrist. No signs of infection. Patient had appointment to see his orthopedics Dr. Lajoyce Corners yesterday and will reschedule appointment for next week.  Patient is clinically stable to be discharged home with outpatient follow-up.  Family Communication: Wife at bedside  Disposition Plan: Home with PICC line  Consultants:  ID  Cardiology/EP  Procedures:  2d echo  Renal US  Abdominal ultrasound  TEE  Antibiotics:  IV vanco and zosyn since 2/21--2/23  Ampicillin 2/23--until 11/19/2015    Discharge Exam: Filed Vitals:   11/13/15 0942 11/13/15 0950  BP: 106/66 108/65  Pulse:  72  Temp: 97.8 F (36.6 C)   Resp: 21 14     General: no acute distress  HEENT: moist mucosa  Chest: Clear bilaterally  CVS: Normal S1 and S2, no murmurs rubs or gallop  GI: Soft, distended tense abdomen, nontender, bowel sounds present  Musculoskeletal: warm, no edema,   CNS: Alert and oriented  Discharge Instructions    Current Discharge Medication List    START taking these medications   Details  ampicillin 2 g in sodium chloride 0.9 % 50 mL Inject 2 g into the vein every 6 (six) hours. Qty: 28 Dose, Refills: 0    tamsulosin (FLOMAX) 0.4 MG CAPS capsule Take 1 capsule (0.4 mg total) by mouth daily after supper. Qty: 30 capsule, Refills: 0  oxyCODONE-acetaminophen (PERCOCET) 5-325 MG tablet Take 1-2 tablets by mouth every 8 (four) hours as needed for severe pain. Qty:15 tablet, Refills: 0      CONTINUE these medications which have NOT CHANGED   Details  albuterol (PROVENTIL HFA;VENTOLIN HFA) 108 (90 BASE) MCG/ACT inhaler Inhale 2 puffs into the lungs every 6 (six) hours as needed for wheezing or shortness of breath.     hydrALAZINE (APRESOLINE) 10 MG tablet Take 20 mg by mouth 2 (two) times daily.     methocarbamol (ROBAXIN) 750 MG tablet Take 1 tablet (750 mg total) by mouth 2 (two) times daily as needed for muscle spasms. Qty: 60 tablet, Refills: 0    Multiple Vitamin  (MULTIVITAMIN WITH MINERALS) TABS Take 1 tablet by mouth every morning.     ondansetron (ZOFRAN) 4 MG tablet Take 1-2 tablets (4-8 mg total) by mouth every 8 (eight) hours as needed for nausea or vomiting. Qty: 40 tablet, Refills: 0                 Allergies  Allergen Reactions  . Lisinopril Swelling  . Tramadol Nausea And Vomiting   Follow-up Information    Please follow up.   Why:  follow up at [facility] in 2 week      Follow up with DUDA,MARCUS V, MD In 1 week.   Specialty:  Orthopedic Surgery   Contact information:   [address] [place] [address] [phone]       Follow up with Will Jorja Loa, MD. Schedule an appointment as soon as possible for a visit in 4 weeks.   Specialty:  Cardiology   Contact information:   [address] [place] [address] [phone]        The results of significant diagnostics from this hospitalization (including imaging, microbiology, ancillary and laboratory) are listed below for reference.    Significant Diagnostic Studies:  US Abdomen Complete  11/10/2015  CLINICAL DATA:  60 year old presenting with acute onset of abdominal distension. EXAM: ABDOMEN ULTRASOUND COMPLETE COMPARISON:  Urinary tract ultrasound 11/12/2015, 12/07/2008. FINDINGS: The examination is technically difficult due to abundant bowel gas. Gallbladder: No shadowing gallstones or echogenic sludge. No gallbladder wall thickening or pericholecystic fluid. Negative sonographic Murphy sign according to the ultrasound technologist. Common bile duct: Diameter: Approximately 5 mm. Liver: Diffusely increased and coarsened echotexture without focal hepatic parenchymal abnormality. Patent portal vein with hepatopetal flow. IVC: Patent in its intrahepatic portion. Obscured outside the liver by bowel gas. Pancreas: Obscured by bowel gas. Spleen: Normal size and echotexture without focal parenchymal abnormality. Right Kidney: Length: Approximately 9.4 cm.  Scarring and marked cortical thinning involving the lower pole. No hydronephrosis. No focal parenchymal abnormality. No shadowing calculi. Left Kidney: Length: Approximately 11.7 cm. No hydronephrosis. Well-preserved cortex. No shadowing calculi. Normal parenchymal echotexture. No focal parenchymal abnormality. Abdominal aorta: Normal in caliber proximally measuring approximately 2.7 cm. Obscured in its mid and distal portion by bowel gas. Other findings: No evidence of ascites. IMPRESSION: 1. Technically limited examination due to abundant bowel gas which obscured the extrahepatic IVC, pancreas, and mid and distal abdominal aorta. Perhaps the bowel gas accounts for the abdominal distention. 2. Diffuse hepatic steatosis and/or hepatocellular disease without focal hepatic parenchymal abnormality. 3. Scarring involving the lower pole of the right kidney. Electronically Signed   By: Hulan Saas M.D.   On: 11/10/2015 18:29   US Renal  11/07/2015  CLINICAL DATA:  Two day history of urinary retention.  Hypertension. EXAM: RENAL ULTRASOUND COMPARISON:  December 07, 2008 FINDINGS: Right Kidney: Length: 10.5 cm. Echogenicity and renal cortical thickness are within normal limits. No mass, perinephric fluid, or hydronephrosis visualized. There is scarring in the lower pole right kidney region. No sonographically demonstrable calculus or ureterectasis. Left Kidney: Length: 11.2 cm. Echogenicity and renal cortical thickness are within normal limits. No mass perinephric fluid, or hydronephrosis visualized. No sonographically demonstrable calculus or ureterectasis. Bladder: Decompressed with a Foley catheter and cannot be assessed. IMPRESSION: Urinary bladder is decompressed with a Foley catheter and cannot be assessed. Scarring lower pole right kidney. Kidneys bilaterally otherwise appear unremarkable. Electronically Signed   By: Bretta Bang III M.D.   On: 11/07/2015 14:50   Ct Elbow Left Wo Contrast  11/13/2015   CLINICAL DATA:  Left elbow pain on going after surgery 2 weeks ago. Oblique fracture of the olecranon base. EXAM: CT OF THE LEFT ELBOW WITHOUT CONTRAST TECHNIQUE: Multidetector CT imaging was performed according to the standard protocol. Multiplanar CT image reconstructions were also generated. COMPARISON:  09/24/2015. FINDINGS: Olecranon plate and screw fixation in place with near-anatomic alignment at the oblique fracture site. The fixator S2 long proximal screws through the it proximal end of the plate which traverse the fracture, as well as multiple transverse screws through the proximal ulna holding the plate in place to traverse the fracture. No hardware failure, new fracture, or specific complicating feature of the plate and screw fixator is identified. The fracture of the proximal ulna is noted to extend into the proximal radial ulnar articulation. There is some spurring of the olecranon and of the distal humerus. Radio capitellar alignment normal. There is a small anterior fracture of the radial head, nondisplaced, images 38- 40 of series 7. The distal biceps tendon appears intact. Triceps tendon edematous and expanded but I do not see obvious discontinuity. The IMPRESSION: 1. Plate and screw fixator of the proximal ulnar fracture is well  positioned and without complicating feature. The fracture extends into the proximal ulnar articular surface along the olecranon, and also into the articular surface of the proximal radial ulnar articulation. Near-anatomic alignment. 2. Small nondisplaced anterior fracture of the radial head. 3. Edematous distal triceps tendon, not unexpected given the adjacent plate. Electronically Signed   By: Gaylyn Rong M.D.   On: 11/13/2015 08:07   Dg Chest Port 1 View  11/06/2015  CLINICAL DATA:  Acute onset of fever and hypoxia. Initial encounter. EXAM: PORTABLE CHEST 1 VIEW COMPARISON:  Chest radiograph performed 09/25/2014 FINDINGS: The lungs are well-aerated. Mild  vascular congestion is noted. Mild bibasilar opacities may reflect atelectasis or possibly mild infection, depending on the patient's symptoms. There is no evidence of pleural effusion or pneumothorax. The cardiomediastinal silhouette is mildly enlarged. No acute osseous abnormalities are seen. IMPRESSION: Mild vascular congestion and mild cardiomegaly. Bibasilar opacities may reflect atelectasis or possibly mild infection, depending on the patient's symptoms. Electronically Signed   By: Roanna Raider M.D.   On: 11/06/2015 04:18    Microbiology: Recent Results (from the past 240 hour(s))  Blood Culture (routine x 2)     Status: None   Collection Time: 11/06/15  3:24 AM  Result Value Ref Range Status   Specimen Description BLOOD RIGHT ARM  Final   Special Requests BOTTLES DRAWN AEROBIC AND ANAEROBIC  Final   Culture  Setup Time   Final    GRAM POSITIVE COCCI IN CHAINS IN PAIRS IN BOTH AEROBIC AND ANAEROBIC BOTTLES CRITICAL RESULT CALLED TO, READ BACK BY AND VERIFIED WITH: Clifton James RN 2114 11/06/15 A BROWNING    Culture ENTEROCOCCUS SPECIES  Final   Report Status 11/09/2015 FINAL  Final   Organism ID, Bacteria ENTEROCOCCUS SPECIES  Final      Susceptibility   Enterococcus species - MIC*    AMPICILLIN <=2 SENSITIVE Sensitive     VANCOMYCIN 1 SENSITIVE Sensitive     GENTAMICIN SYNERGY SENSITIVE Sensitive     * ENTEROCOCCUS SPECIES  Blood Culture (routine x 2)     Status: None   Collection Time: 11/06/15  3:40 AM  Result Value Ref Range Status   Specimen Description BLOOD RIGHT HAND  Final   Special Requests BOTTLES DRAWN AEROBIC AND ANAEROBIC  Final   Culture  Setup Time   Final    GRAM POSITIVE COCCI IN CHAINS IN PAIRS IN BOTH AEROBIC AND ANAEROBIC BOTTLES CRITICAL RESULT CALLED TO, READ BACK BY AND VERIFIED WITH: Clifton James RN 2114 11/06/15 A BROWNING    Culture   Final    ENTEROCOCCUS SPECIES SUSCEPTIBILITIES PERFORMED ON PREVIOUS CULTURE WITHIN THE LAST 5 DAYS.     Report Status 11/09/2015 FINAL  Final  Urine culture     Status: None   Collection Time: 11/06/15  4:07 AM  Result Value Ref Range Status   Specimen Description URINE, RANDOM  Final   Special Requests NONE  Final   Culture >=100,000 COLONIES/mL ENTEROCOCCUS SPECIES  Final   Report Status 11/08/2015 FINAL  Final   Organism ID, Bacteria ENTEROCOCCUS SPECIES  Final      Susceptibility   Enterococcus species - MIC*    AMPICILLIN <=2 SENSITIVE Sensitive     LEVOFLOXACIN 0.5 SENSITIVE Sensitive     NITROFURANTOIN <=16 SENSITIVE Sensitive     VANCOMYCIN 1 SENSITIVE Sensitive     * >=100,000 COLONIES/mL ENTEROCOCCUS SPECIES  Culture, sputum-assessment     Status: None   Collection Time: 11/06/15  9:25 AM  Result Value Ref Range Status   Specimen Description SPUTUM  Final   Special Requests NONE  Final   Sputum evaluation   Final    THIS SPECIMEN IS ACCEPTABLE. RESPIRATORY CULTURE REPORT TO FOLLOW.   Report Status 11/06/2015 FINAL  Final  Gram stain     Status: None   Collection Time: 11/06/15  9:26 AM  Result Value Ref Range Status   Specimen Description SPUTUM  Final   Special Requests NONE  Final   Gram Stain   Final    MODERATE WBC PRESENT, PREDOMINANTLY PMN MODERATE GRAM POSITIVE COCCI IN PAIRS FEW GRAM NEGATIVE RODS RARE GRAM POSITIVE RODS    Report Status 11/06/2015 FINAL  Final  Culture, respiratory (NON-Expectorated)     Status: None   Collection Time: 11/06/15  9:26 AM  Result Value Ref Range Status   Specimen Description SPUTUM  Final   Special Requests NONE  Final   Gram Stain   Final    MODERATE WBC PRESENT, PREDOMINANTLY PMN FEW SQUAMOUS EPITHELIAL CELLS PRESENT MODERATE GRAM POSITIVE COCCI IN PAIRS FEW GRAM NEGATIVE RODS RARE GRAM POSITIVE RODS Performed at [facility] THIS SPECIMEN IS ACCEPTABLE FOR SPUTUM CULTURE Performed at [facility]    Culture   Final    NORMAL OROPHARYNGEAL FLORA Performed at [facility]    Report Status  11/09/2015 FINAL  Final  Culture, blood (Routine X 2) w Reflex to ID Panel     Status: None (Preliminary result)   Collection Time: 11/08/15 11:57 AM  Result Value Ref Range Status   Specimen Description BLOOD LEFT HAND  Final   Special Requests BOTTLES DRAWN AEROBIC AND ANAEROBIC 5CC  Final   Culture NO GROWTH 4 DAYS  Final   Report Status PENDING  Incomplete  Culture, blood (Routine X 2) w Reflex to ID Panel     Status: None (Preliminary result)   Collection Time: 11/08/15 11:57 AM  Result Value Ref Range Status   Specimen Description BLOOD RIGHT HAND  Final   Special Requests BOTTLES DRAWN AEROBIC AND ANAEROBIC 5CC  Final   Culture NO GROWTH 4 DAYS  Final   Report Status PENDING  Incomplete     Labs: Basic Metabolic Panel:  Recent Labs Lab 11/07/15 1900 11/09/15 0535  NA 139 141  K 3.7 4.1  CL 106 109  CO2 21* 25  GLUCOSE 143* 103*  BUN 12 12  CREATININE 1.80* 1.67*  CALCIUM 8.6* 9.1  MG 1.7  --    Liver Function Tests: No results for input(s): AST, ALT, ALKPHOS, BILITOT, PROT, ALBUMIN in the last 168 hours. No results for input(s): LIPASE, AMYLASE in the last 168 hours. No results for input(s): AMMONIA in the last 168 hours. CBC: No results for input(s): WBC, NEUTROABS, HGB, HCT, MCV, PLT in the last 168 hours. Cardiac Enzymes: No results for input(s): CKTOTAL, CKMB, CKMBINDEX, TROPONINI in the last 168 hours. BNP: BNP (last 3 results)  Recent Labs  11/06/15 0324  BNP 18.8    ProBNP (last 3 results) No results for input(s): PROBNP in the last 8760 hours.  CBG:  Recent Labs Lab 11/07/15 0745 11/08/15 2306  GLUCAP 120* 109*       Signed:  Eddie North MD.  [facility] 11/13/2015, 11:06 AM

## 2015-11-13 NOTE — Progress Notes (Signed)
Peripherally Inserted Central Catheter/Midline Placement  The IV Nurse has discussed with the patient and/or persons authorized to consent for the patient, the purpose of this procedure and the potential benefits and risks involved with this procedure.  The benefits include less needle sticks, lab draws from the catheter and patient may be discharged home with the catheter.  Risks include, but not limited to, infection, bleeding, blood clot (thrombus formation), and puncture of an artery; nerve damage and irregular heat beat.  Alternatives to this procedure were also discussed.  PICC/Midline Placement Documentation  PICC Single Lumen 11/13/15 PICC Right Basilic 39 cm 0 cm (Active)  Indication for Insertion or Continuance of Line Home intravenous therapies (PICC only) 11/13/2015 12:00 PM  Exposed Catheter (cm) 0 cm 11/13/2015 12:00 PM  Dressing Change Due 11/20/15 11/13/2015 12:00 PM       Consuello Masse 11/13/2015, 12:13 PM

## 2015-11-14 ENCOUNTER — Encounter (HOSPITAL_COMMUNITY): Payer: Self-pay | Admitting: Cardiovascular Disease

## 2015-11-14 DIAGNOSIS — I441 Atrioventricular block, second degree: Secondary | ICD-10-CM | POA: Diagnosis not present

## 2015-11-14 DIAGNOSIS — I129 Hypertensive chronic kidney disease with stage 1 through stage 4 chronic kidney disease, or unspecified chronic kidney disease: Secondary | ICD-10-CM | POA: Diagnosis not present

## 2015-11-14 DIAGNOSIS — J188 Other pneumonia, unspecified organism: Secondary | ICD-10-CM | POA: Diagnosis not present

## 2015-11-14 DIAGNOSIS — N183 Chronic kidney disease, stage 3 (moderate): Secondary | ICD-10-CM | POA: Diagnosis not present

## 2015-11-14 DIAGNOSIS — N3000 Acute cystitis without hematuria: Secondary | ICD-10-CM | POA: Diagnosis not present

## 2015-11-14 DIAGNOSIS — B952 Enterococcus as the cause of diseases classified elsewhere: Secondary | ICD-10-CM | POA: Diagnosis not present

## 2015-11-19 NOTE — Progress Notes (Signed)
Cardiology Office Note:    Date:  11/20/2015   ID:  Malik GeeHarry E Hudler, DOB 1956/03/13, MRN [id number]  PCP:  Dorrene GermanEdwin A Avbuere, MD  Cardiologist:  Dr. Verdis PrimeHenry Smith  (seen in hospital in 2014) Electrophysiologist:  Dr. Loman BrooklynWill Camnitz  [facility] [place] [place]   Chief Complaint  Patient presents with  . Hospitalization Follow-up    admx with enterococcus bacteremia in setting of CAP; 2nd degree HB noted during admit (Mobitz 1 and 2) >> OP FU with EP recommended     History of Present Illness:     Malik Mullins is a 60 y.o. male with a hx of HTN, bilateral lower leg DVT, CKD.  Evaluated by Dr. Sherryl MangesSteven Klein in 2014 in the hospital when he was admitted for syncope.  He was noted to have nocturnal complete heart block that was felt to be related to OSA. PPM was not recommended at that time.    Admitted 2/21-2/28 with acute hypoxic respiratory failure in the setting of bilateral lobar pneumonia and urosepsis with enterococcal bacteremia and urinary retention. TEE and echocardiogram negative for vegetation. He was discharged with plans to continue 2 weeks of IV ampicillin. He was followed by ID. Patient was noted to have heart rates in the 30s on telemetry with Mobitz type I and Mobitz type II and 2:1 AV block. He was seen by electrophysiology. He was asymptomatic with AV block and he did not want to pursue PPM.  Recommendation was to FU with EP Clinic after infectious issues have cleared.  Patient was noted to have a history of snoring. Outpatient sleep study was recommended.     Past Medical History  Diagnosis Date  . Hypertension   . Back pain   . DVT (deep venous thrombosis) (HCC)     left leg and right leg  . Knee joint pain   . Pneumonia     Past Surgical History  Procedure Laterality Date  . Mandible surgery    . Orif elbow fracture Left 09/26/2015    Procedure: OPEN REDUCTION INTERNAL FIXATION (ORIF) LEFT OLECRANON FRACTURE;  Surgeon: Tarry KosNaiping M Xu, MD;  Location: [facility];  Service: Orthopedics;  Laterality: Left;  . Tee without cardioversion N/A 11/13/2015    Procedure: TRANSESOPHAGEAL ECHOCARDIOGRAM (TEE);  Surgeon: Wendall StadePeter C Nishan, MD;  [facility];  Service: Cardiovascular;  Laterality: N/A;    Current Medications: Outpatient Prescriptions Prior to Visit  Medication Sig Dispense Refill  . albuterol (PROVENTIL HFA;VENTOLIN HFA) 108 (90 BASE) MCG/ACT inhaler Inhale 2 puffs into the lungs every 6 (six) hours as needed for wheezing or shortness of breath.     Marland Kitchen. ampicillin 2 g in sodium chloride 0.9 % 50 mL Inject 2 g into the vein every 6 (six) hours. 28 Dose 0  . hydrALAZINE (APRESOLINE) 10 MG tablet Take 20 mg by mouth 2 (two) times daily.     . methocarbamol (ROBAXIN) 750 MG tablet Take 1 tablet (750 mg total) by mouth 2 (two) times daily as needed for muscle spasms. 60 tablet 0  . Multiple Vitamin (MULTIVITAMIN WITH MINERALS) TABS Take 1 tablet by mouth every morning.     . ondansetron (ZOFRAN) 4 MG tablet Take 1-2 tablets (4-8 mg total) by mouth every 8 (eight) hours as needed for nausea or vomiting. 40 tablet 0  . oxyCODONE-acetaminophen (PERCOCET) 5-325 MG tablet Take 1-2 tablets by mouth every 8 (eight) hours as needed for severe pain. 15 tablet 0  . tamsulosin (FLOMAX)  0.4 MG CAPS capsule Take 1 capsule (0.4 mg total) by mouth daily after supper. 30 capsule 0   No facility-administered medications prior to visit.     Allergies:   Lisinopril and Tramadol   Social History   Social History  . Marital Status: Married    Spouse Name: N/A  . Number of Children: N/A  . Years of Education: N/A   Social History Main Topics  . Smoking status: Current Some Day Smoker -- 0.30 packs/day for 40 years    Types: Cigarettes  . Smokeless tobacco: Never Used  . Alcohol Use: Yes     Comment: "from time to time"  . Drug Use: No  . Sexual Activity: No   Other Topics Concern  . Not on file   Social History Narrative     Family  History:  The patient's family history includes Diabetes in his father; Hypertension in his mother.   ROS:   Please see the history of present illness.    ROS All other systems reviewed and are negative.   Physical Exam:    VS:  There were no vitals taken for this visit.   GEN: Well nourished, well developed, in no acute distress HEENT: normal Neck: no JVD, no masses Cardiac: Normal S1/S2, RRR; no murmurs, rubs, or gallops, no edema;   carotid bruits,   Respiratory:  clear to auscultation bilaterally; no wheezing, rhonchi or rales GI: soft, nontender, nondistended, + BS MS: no deformity or atrophy Skin: warm and dry, no rash Neuro:  Bilateral strength equal, no focal deficits  Psych: Alert and oriented x 3, normal affect  Wt Readings from Last 3 Encounters:  11/11/15 241 lb (109.317 kg)  09/26/15 237 lb (107.502 kg)  09/24/15 238 lb (107.956 kg)      Studies/Labs Reviewed:     EKG:  EKG is  ordered today.  The ekg ordered today demonstrates   Recent Labs: 11/06/2015: ALT 19; B Natriuretic Peptide 18.8; Hemoglobin 16.0; Platelets 152 11/07/2015: Magnesium 1.7 11/09/2015: BUN 12; Creatinine, Ser 1.67*; Potassium 4.1; Sodium 141   Recent Lipid Panel    Component Value Date/Time   CHOL 188 10/12/2008 2047   TRIG 282* 10/12/2008 2047   HDL 50 10/12/2008 2047   CHOLHDL 3.8 Ratio 10/12/2008 2047   VLDL 56* 10/12/2008 2047   LDLCALC 82 10/12/2008 2047    Additional studies/ records that were reviewed today include:    TEE 11/13/15 EF 60-65%, normal wall motion, dilated sinus of Valsalva 4.3 cm, trivial AI, no LAA clot  Echo 11/09/15 EF 55%, inferior hypokinesis could not be ruled out   ASSESSMENT:     1. Second degree AV block   2. Snoring   3. CKD (chronic kidney disease), stage 3 (moderate)   4. Essential hypertension   5. Enterococcal bacteremia     PLAN:     In order of problems listed above:  1. Second-degree AV block  -  Patient was supposed to be set  up with EP post DC. On my schedule today.  Will arrange 30 day event monitor and schedule FU with Dr. Loman Brooklyn after monitor is complete to further discuss +/- PPM.   2. Snoring -   Patient needs to be scheduled for a sleep study.    3. CKD -   4. HTN -   5. Hx of Enterococcal Bacteremia -     Medication Adjustments/Labs and Tests Ordered: Current medicines are reviewed at length with the patient today.  Concerns regarding medicines are outlined above.  Medication changes, Labs and Tests ordered today are outlined in the Patient Instructions noted below. There are no Patient Instructions on file for this visit. Signed, Tereso Newcomer, PA-C  11/20/2015 10:37 AM    [facility] [address] [place], [place], [place]  [address] Phone: [phone]; Fax: [phone]     This encounter was created in error - please disregard.

## 2015-11-20 ENCOUNTER — Encounter: Payer: Non-veteran care | Admitting: Physician Assistant

## 2015-12-05 ENCOUNTER — Encounter: Payer: Self-pay | Admitting: Physician Assistant

## 2016-01-01 DIAGNOSIS — S52022D Displaced fracture of olecranon process without intraarticular extension of left ulna, subsequent encounter for closed fracture with routine healing: Secondary | ICD-10-CM | POA: Diagnosis not present

## 2016-04-23 IMAGING — CR DG CHEST 1V PORT
1 series · 1 of 1 positions shown · non-contrast
Comparison: Chest radiograph performed 09/25/2014

CLINICAL DATA: Acute onset of fever and hypoxia. Initial encounter.

EXAM:
PORTABLE CHEST 1 VIEW

[AP]
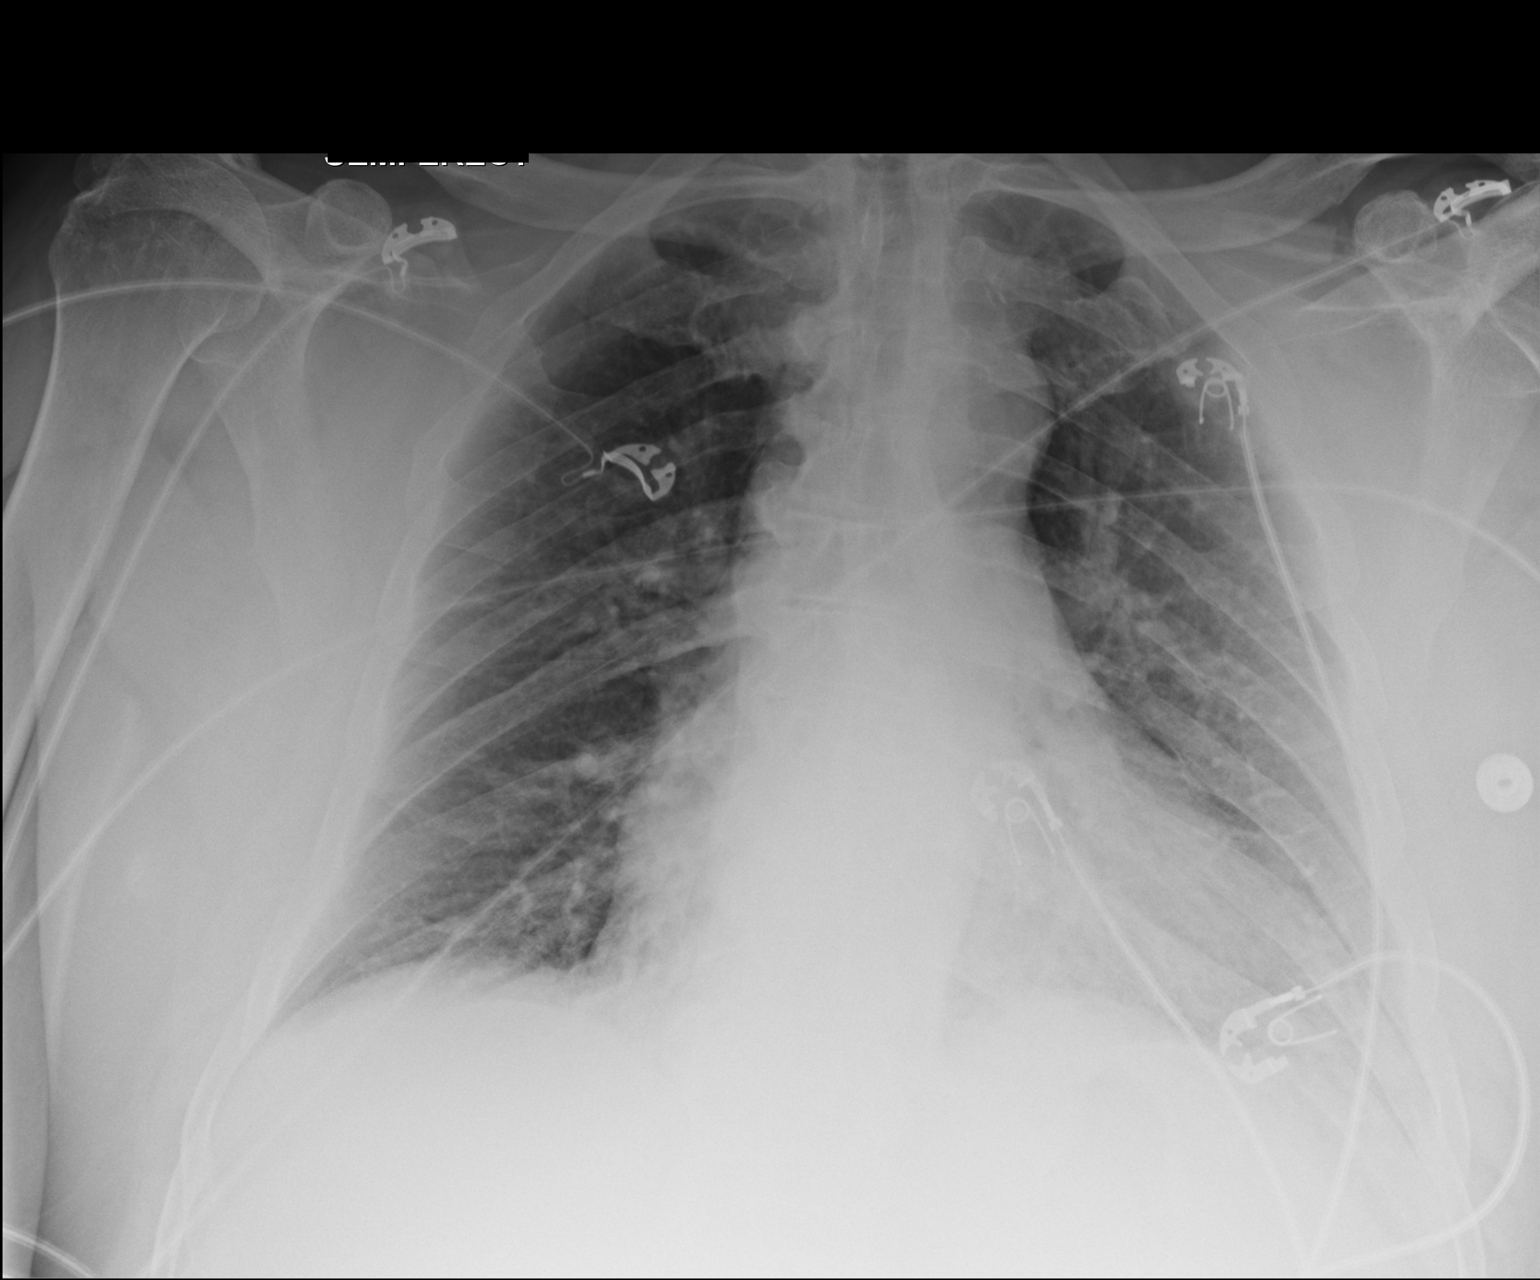

[1 of 1 positions shown; findings below may reference images not displayed]

FINDINGS: The lungs are well-aerated. Mild vascular congestion is noted. Mild
bibasilar opacities may reflect atelectasis or possibly mild
infection, depending on the patient's symptoms. There is no evidence
of pleural effusion or pneumothorax.

The cardiomediastinal silhouette is mildly enlarged. No acute
osseous abnormalities are seen.
IMPRESSION: Mild vascular congestion and mild cardiomegaly. Bibasilar opacities
may reflect atelectasis or possibly mild infection, depending on the
patient's symptoms.

## 2016-04-29 IMAGING — CT CT ELBOW*L* W/O CM
3 of 5 series · 12 of 34 positions shown, 15 images · non-contrast
Comparison: 09/24/2015.

CLINICAL DATA: Left elbow pain on going after surgery 2 weeks ago.
Oblique fracture of the olecranon base.

EXAM:
CT OF THE LEFT ELBOW WITHOUT CONTRAST
TECHNIQUE: Multidetector CT imaging was performed according to the standard
protocol. Multiplanar CT image reconstructions were also generated.

[Series 5: lfov ext 3.0 i31s 2 · axial · 0.47mm/px · z∈[+1776,+1924]mm · 6 of 62 slices shown, 8 images]
[im 10/62  soft-tissue]
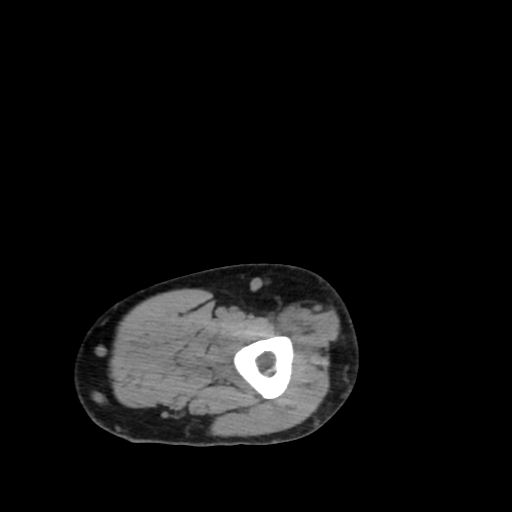
[im 10/62  bone]
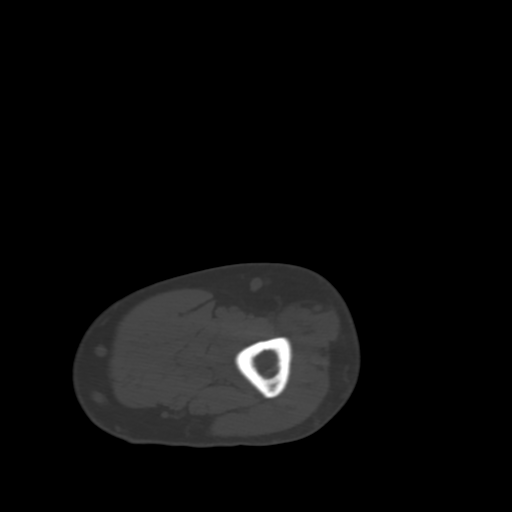
[im 19/62  bone]
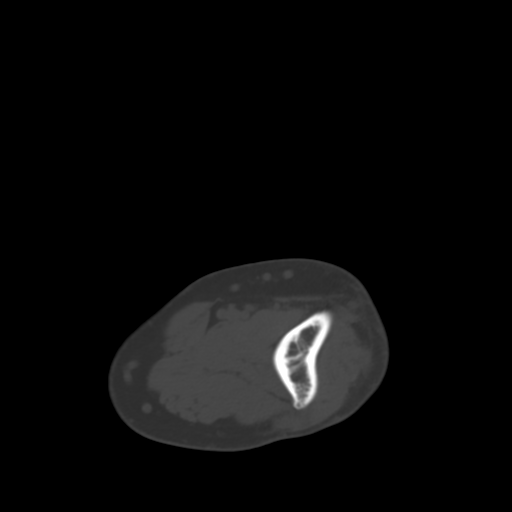
[im 29/62  bone]
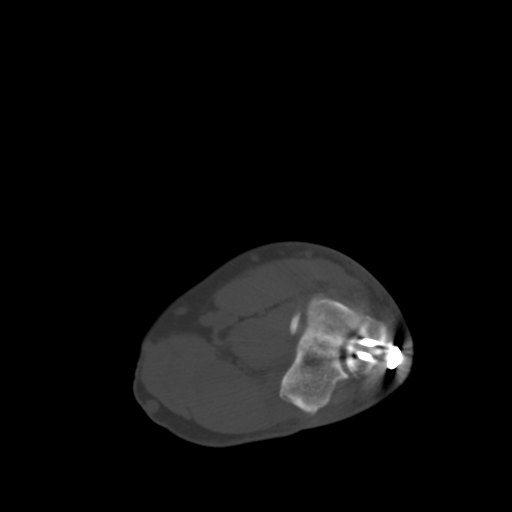
[im 38/62  bone]
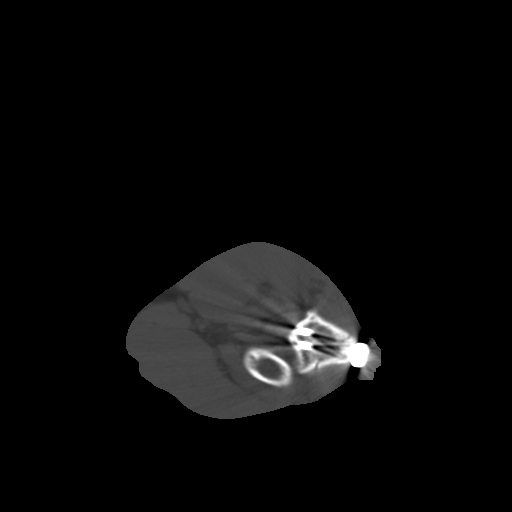
[im 47/62  soft-tissue]
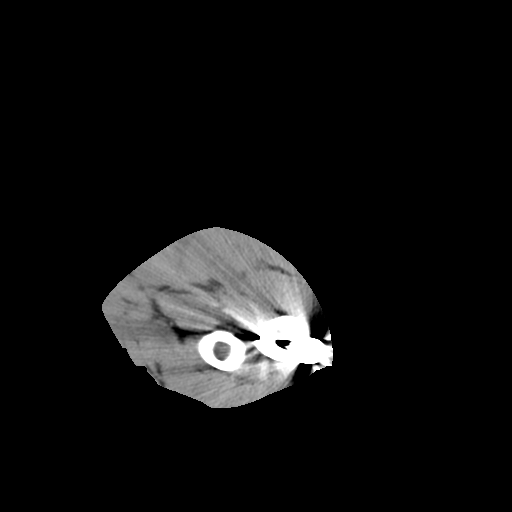
[im 47/62  bone]
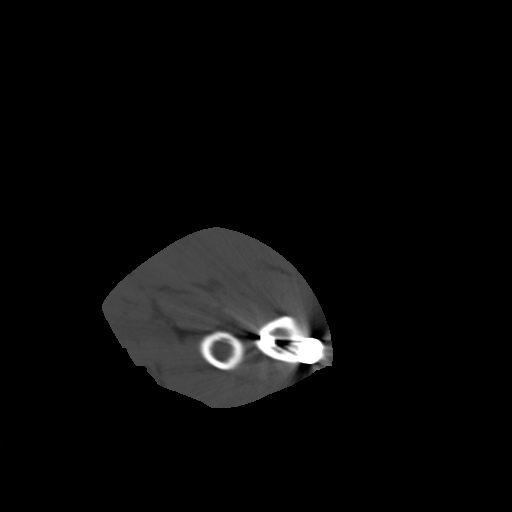
[im 57/62  bone]
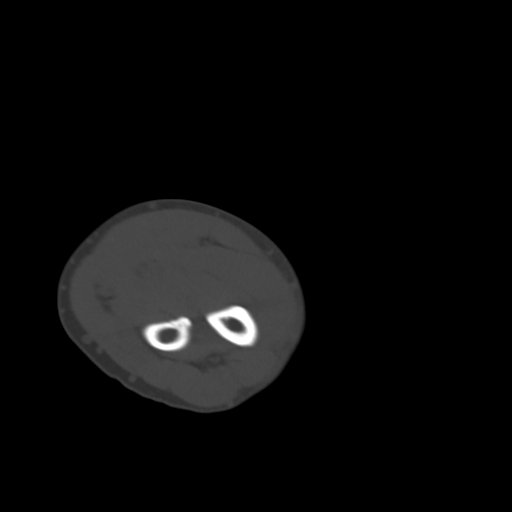

[Series 7: sag bone · coronal · 0.38mm/px · 1 of 67 slices shown]
[im 34/67  bone]
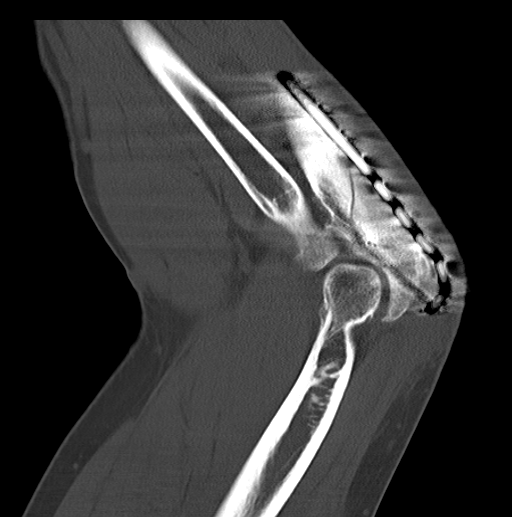

[Series 12: cor bone · sagittal · 0.23mm/px · 5 of 44 slices shown, 6 images]
[im 7/44  bone]
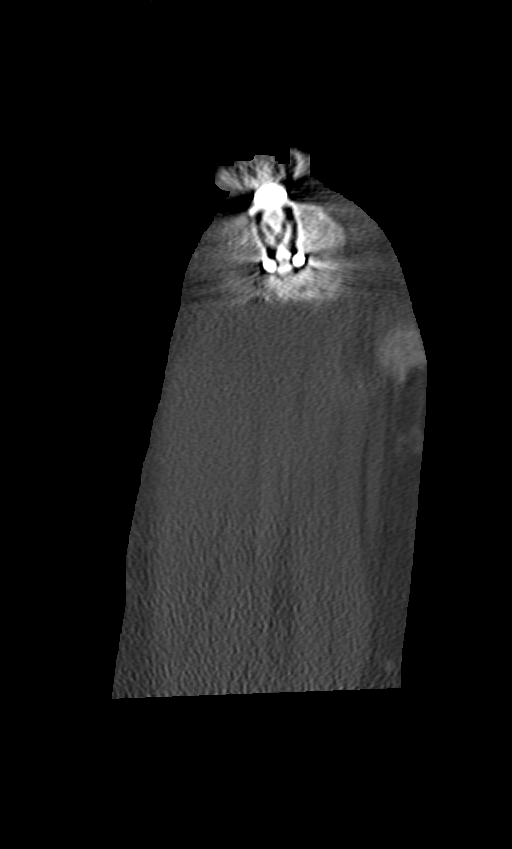
[im 14/44  soft-tissue]
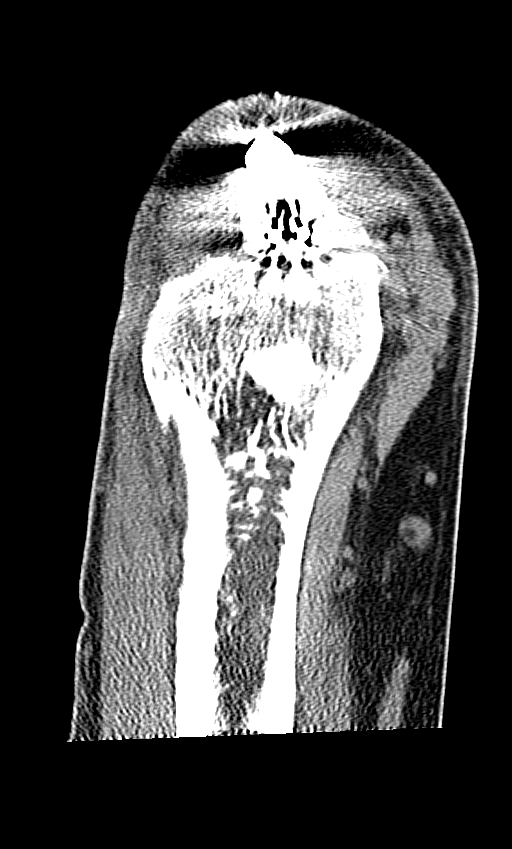
[im 14/44  bone]
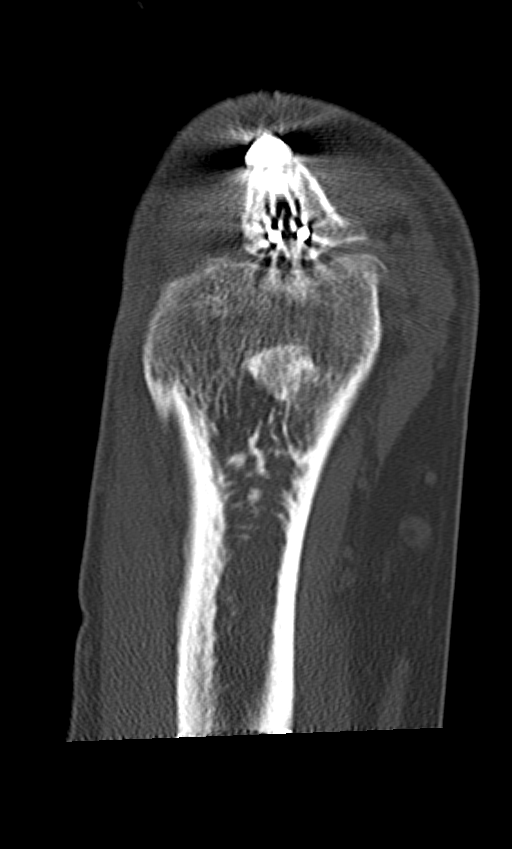
[im 21/44  bone]
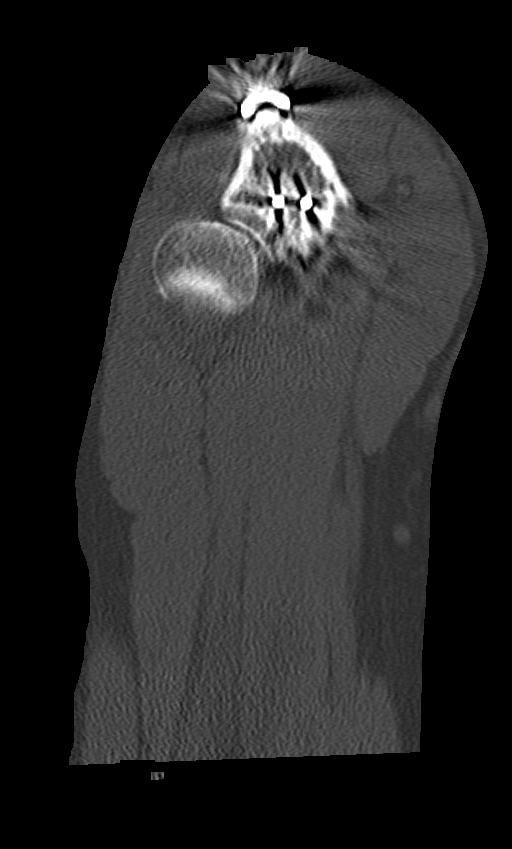
[im 27/44  bone]
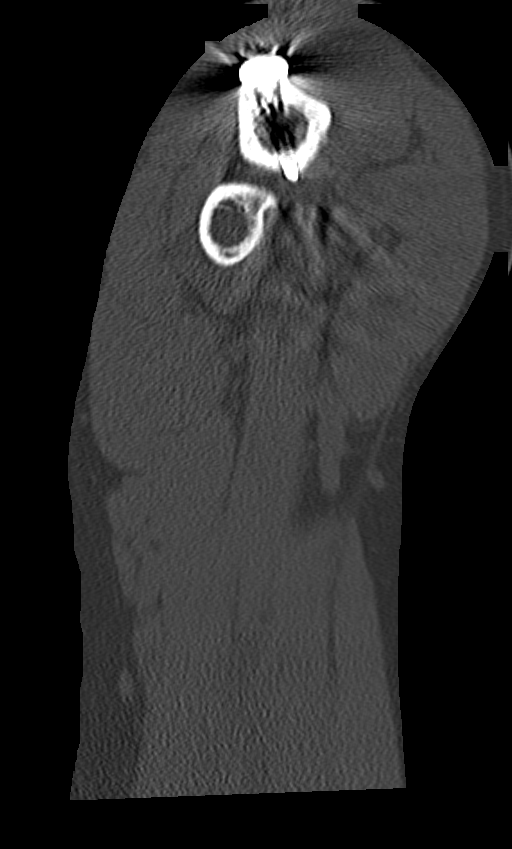
[im 34/44  bone]
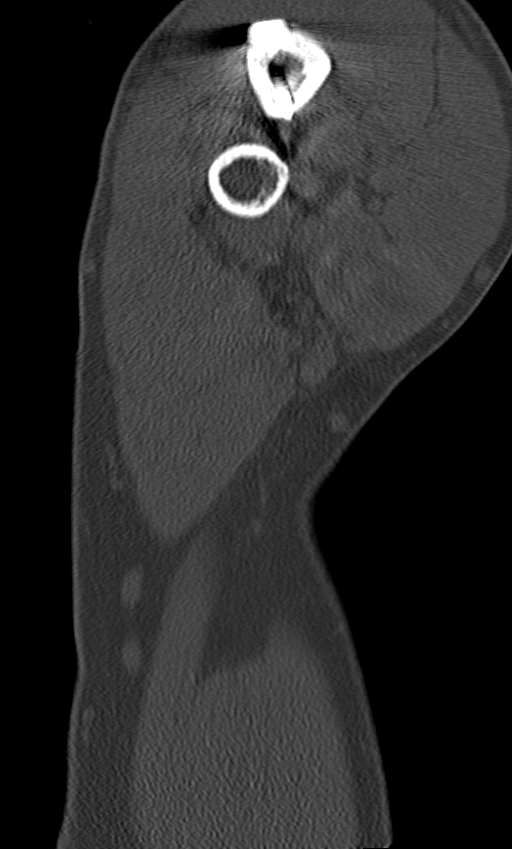

[12 of 34 positions shown; findings below may reference images not displayed]

FINDINGS: Olecranon plate and screw fixation in place with near-anatomic
alignment at the oblique fracture site. The fixator S2 long proximal
screws through the it proximal end of the plate which traverse the
fracture, as well as multiple transverse screws through the proximal
ulna holding the plate in place to traverse the fracture. No
hardware failure, new fracture, or specific complicating feature of
the plate and screw fixator is identified. The fracture of the
proximal ulna is noted to extend into the proximal radial ulnar
articulation.

There is some spurring of the olecranon and of the distal humerus.
Radio capitellar alignment normal. There is a small anterior
fracture of the radial head, nondisplaced, images 38- 40 of series
7.

The distal biceps tendon appears intact. Triceps tendon edematous
and expanded but I do not see obvious discontinuity. The
IMPRESSION: 1. Plate and screw fixator of the proximal ulnar fracture is well
positioned and without complicating feature. The fracture extends
into the proximal ulnar articular surface along the olecranon, and
also into the articular surface of the proximal radial ulnar
articulation. Near-anatomic alignment.
2. Small nondisplaced anterior fracture of the radial head.
3. Edematous distal triceps tendon, not unexpected given the
adjacent plate.

## 2016-12-02 ENCOUNTER — Ambulatory Visit: Payer: Medicare Other | Admitting: Sports Medicine

## 2016-12-16 ENCOUNTER — Ambulatory Visit: Payer: Medicare Other | Admitting: Sports Medicine

## 2016-12-30 ENCOUNTER — Ambulatory Visit: Payer: Medicare Other | Admitting: Sports Medicine

## 2017-03-06 ENCOUNTER — Emergency Department (HOSPITAL_COMMUNITY): Payer: Non-veteran care

## 2017-03-06 ENCOUNTER — Emergency Department (HOSPITAL_COMMUNITY)
Admission: EM | Admit: 2017-03-06 | Discharge: 2017-03-06 | Disposition: A | Discharge status: Home / Self Care | Payer: Non-veteran care | Attending: Emergency Medicine | Admitting: Emergency Medicine

## 2017-03-06 ENCOUNTER — Encounter (HOSPITAL_COMMUNITY): Payer: Self-pay | Admitting: Emergency Medicine

## 2017-03-06 DIAGNOSIS — M545 Low back pain: Secondary | ICD-10-CM | POA: Insufficient documentation

## 2017-03-06 DIAGNOSIS — J208 Acute bronchitis due to other specified organisms: Secondary | ICD-10-CM

## 2017-03-06 DIAGNOSIS — I129 Hypertensive chronic kidney disease with stage 1 through stage 4 chronic kidney disease, or unspecified chronic kidney disease: Secondary | ICD-10-CM | POA: Insufficient documentation

## 2017-03-06 DIAGNOSIS — M5416 Radiculopathy, lumbar region: Secondary | ICD-10-CM | POA: Diagnosis not present

## 2017-03-06 DIAGNOSIS — N183 Chronic kidney disease, stage 3 (moderate): Secondary | ICD-10-CM | POA: Diagnosis not present

## 2017-03-06 DIAGNOSIS — Z79899 Other long term (current) drug therapy: Secondary | ICD-10-CM | POA: Insufficient documentation

## 2017-03-06 DIAGNOSIS — F17210 Nicotine dependence, cigarettes, uncomplicated: Secondary | ICD-10-CM | POA: Insufficient documentation

## 2017-03-06 DIAGNOSIS — R0602 Shortness of breath: Secondary | ICD-10-CM | POA: Diagnosis not present

## 2017-03-06 DIAGNOSIS — R05 Cough: Secondary | ICD-10-CM | POA: Diagnosis not present

## 2017-03-06 DIAGNOSIS — M5410 Radiculopathy, site unspecified: Secondary | ICD-10-CM

## 2017-03-06 LAB — CBC WITH DIFFERENTIAL/PLATELET
BASOS ABS: 0 10*3/uL (ref 0.0–0.1)
Basophils Relative: 1 %
Eosinophils Absolute: 0.2 10*3/uL (ref 0.0–0.7)
Eosinophils Relative: 5 %
HCT: 45.1 % (ref 39.0–52.0)
HEMOGLOBIN: 14.9 g/dL (ref 13.0–17.0)
LYMPHS PCT: 30 %
Lymphs Abs: 1.6 10*3/uL (ref 0.7–4.0)
MCH: 27.4 pg (ref 26.0–34.0)
MCHC: 33 g/dL (ref 30.0–36.0)
MCV: 83.1 fL (ref 78.0–100.0)
MONO ABS: 0.5 10*3/uL (ref 0.1–1.0)
Monocytes Relative: 9 %
NEUTROS ABS: 3 10*3/uL (ref 1.7–7.7)
Neutrophils Relative %: 55 %
PLATELETS: 166 10*3/uL (ref 150–400)
RBC: 5.43 MIL/uL (ref 4.22–5.81)
RDW: 14.1 % (ref 11.5–15.5)
WBC: 5.3 10*3/uL (ref 4.0–10.5)

## 2017-03-06 LAB — COMPREHENSIVE METABOLIC PANEL
ALBUMIN: 3.6 g/dL (ref 3.5–5.0)
ALT: 18 U/L (ref 17–63)
ANION GAP: 8 (ref 5–15)
AST: 30 U/L (ref 15–41)
Alkaline Phosphatase: 90 U/L (ref 38–126)
BUN: 17 mg/dL (ref 6–20)
CHLORIDE: 107 mmol/L (ref 101–111)
CO2: 22 mmol/L (ref 22–32)
Calcium: 8.7 mg/dL — ABNORMAL LOW (ref 8.9–10.3)
Creatinine, Ser: 1.84 mg/dL — ABNORMAL HIGH (ref 0.61–1.24)
GFR calc Af Amer: 44 mL/min — ABNORMAL LOW (ref >60)
GFR calc non Af Amer: 38 mL/min — ABNORMAL LOW (ref >60)
GLUCOSE: 107 mg/dL — AB (ref 65–99)
POTASSIUM: 3.8 mmol/L (ref 3.5–5.1)
SODIUM: 137 mmol/L (ref 135–145)
TOTAL PROTEIN: 6.3 g/dL — AB (ref 6.5–8.1)
Total Bilirubin: 0.8 mg/dL (ref 0.3–1.2)

## 2017-03-06 LAB — BRAIN NATRIURETIC PEPTIDE: B NATRIURETIC PEPTIDE 5: 18.1 pg/mL (ref 0.0–100.0)

## 2017-03-06 MED ORDER — PREDNISONE 10 MG PO TABS: 20.0000 mg | ORAL_TABLET | Freq: Two times a day (BID) | ORAL | 0 refills | Status: DC

## 2017-03-06 MED ORDER — ALBUTEROL SULFATE HFA 108 (90 BASE) MCG/ACT IN AERS
2.0000 | INHALATION_SPRAY | RESPIRATORY_TRACT | Status: DC | PRN
  Administered 2017-03-06: 2 via RESPIRATORY_TRACT
  Filled 2017-03-06: qty 6.7

## 2017-03-06 MED ORDER — IPRATROPIUM-ALBUTEROL 0.5-2.5 (3) MG/3ML IN SOLN
3.0000 mL | Freq: Once | RESPIRATORY_TRACT | Status: AC
  Administered 2017-03-06: 3 mL via RESPIRATORY_TRACT
  Filled 2017-03-06: qty 3

## 2017-03-06 MED ORDER — AZITHROMYCIN 250 MG PO TABS: 250.0000 mg | ORAL_TABLET | Freq: Every day | ORAL | 0 refills | Status: DC

## 2017-03-06 MED ORDER — HYDROCODONE-ACETAMINOPHEN 5-325 MG PO TABS: 1.0000 | ORAL_TABLET | Freq: Four times a day (QID) | ORAL | 0 refills | Status: DC | PRN

## 2017-03-06 NOTE — ED Notes (Signed)
Patient transported to X-ray 

## 2017-03-06 NOTE — ED Provider Notes (Signed)
[facility] Provider Note   CSN: [id number] Arrival date & time: 03/06/17  4098     History   Chief Complaint Chief Complaint  Patient presents with  . Back Pain  . Shortness of Breath    HPI Malik Mullins is a 61 y.o. male.  Patient is a 61 year old male with past medical history of asthma, hypertension, prior DVT presenting with complaints of low back pain and shortness of breath. Each has been ongoing for several months. He denies any fevers or chills. He denies any chest pain or productive cough. He has been prescribed inhalers, however these have run out.  His back pain began several months ago and he describes radiation into both legs with no weakness, numbness, or bowel or bladder complaints. He denies any specific injury or trauma. He does report lifting heavy objects for "most of his life".      Past Medical History:  Diagnosis Date  . Back pain   . DVT (deep venous thrombosis) (HCC)    left leg and right leg  . Hypertension   . Knee joint pain   . Pneumonia     Patient Active Problem List   Diagnosis Date Noted  . Sleep apnea 11/13/2015  . Alcoholic fatty liver 11/13/2015  . Chronic kidney disease (CKD) stage G3a/A1, moderately decreased glomerular filtration rate (GFR) between 45-59 mL/min/1.73 square meter and albuminuria creatinine ratio less than 30 mg/g 11/13/2015  . Abdominal distension   . Arm pain   . Mobitz (type) I (Wenckebach's) atrioventricular block   . Enterococcal bacteremia   . Candida UTI   . Acute cystitis without hematuria   . Poor dentition 11/07/2015  . Sepsis (HCC) 11/06/2015  . Bilateral pneumonia 11/06/2015  . Urinary retention   . Transient complete heart block vs blocked PACs 03/04/2013    Class: Acute  . Syncope and collapse 03/03/2013  . CAP (community acquired pneumonia) 10/28/2012  . Hypertension 10/27/2012  . Osteoarthritis of both knees 10/27/2012  . History of DVT (deep vein thrombosis)-? 2012 10/27/2012      Past Surgical History:  Procedure Laterality Date  . MANDIBLE SURGERY    . ORIF ELBOW FRACTURE Left 09/26/2015   Procedure: OPEN REDUCTION INTERNAL FIXATION (ORIF) LEFT OLECRANON FRACTURE;  Surgeon: Tarry Kos, MD;  Location: [facility];  Service: Orthopedics;  Laterality: Left;  . TEE WITHOUT CARDIOVERSION N/A 11/13/2015   Procedure: TRANSESOPHAGEAL ECHOCARDIOGRAM (TEE);  Surgeon: Wendall Stade, MD;  Location: [facility];  Service: Cardiovascular;  Laterality: N/A;       Home Medications    Prior to Admission medications   Medication Sig Start Date End Date Taking? Authorizing Provider  albuterol (PROVENTIL HFA;VENTOLIN HFA) 108 (90 BASE) MCG/ACT inhaler Inhale 2 puffs into the lungs every 6 (six) hours as needed for wheezing or shortness of breath.    Yes [provider]  hydrALAZINE (APRESOLINE) 10 MG tablet Take 20 mg by mouth 2 (two) times daily.    Yes [provider]  methocarbamol (ROBAXIN) 750 MG tablet Take 1 tablet (750 mg total) by mouth 2 (two) times daily as needed for muscle spasms. 09/26/15  Yes Tarry Kos, MD  Multiple Vitamin (MULTIVITAMIN WITH MINERALS) TABS Take 1 tablet by mouth every morning.    Yes [provider]  ondansetron (ZOFRAN) 4 MG tablet Take 1-2 tablets (4-8 mg total) by mouth every 8 (eight) hours as needed for nausea or vomiting. 09/26/15  Yes Tarry Kos, MD  oxyCODONE-acetaminophen (PERCOCET) 5-325 MG tablet Take 1-2 tablets by mouth every 8 (eight) hours as needed for severe pain. 11/13/15  Yes Dhungel, Theda BelfastNishant, MD    Family History Family History  Problem Relation Age of Onset  . Hypertension Mother   . Diabetes Father     Social History Social History  Substance Use Topics  . Smoking status: Current Some Day Smoker    Packs/day: 0.30    Years: 40.00    Types: Cigarettes  . Smokeless tobacco: Never Used  . Alcohol use Yes     Comment: "from time to time"     Allergies   Lisinopril  and Tramadol   Review of Systems Review of Systems  All other systems reviewed and are negative.    Physical Exam Updated Vital Signs BP (!) 153/75 (BP Location: Right Arm)   Pulse 75   Temp 97.7 F (36.5 C) (Oral)   Resp 18   Ht 6\' 2"  (1.88 m)   Wt 111.1 kg (245 lb)   SpO2 98%   BMI 31.46 kg/m   Physical Exam  Constitutional: He is oriented to person, place, and time. He appears well-developed and well-nourished. No distress.  HENT:  Head: Normocephalic and atraumatic.  Mouth/Throat: Oropharynx is clear and moist.  Neck: Normal range of motion. Neck supple.  Cardiovascular: Normal rate and regular rhythm.  Exam reveals no friction rub.   No murmur heard. Pulmonary/Chest: Effort normal and breath sounds normal. No respiratory distress. He has no wheezes. He has no rales.  Abdominal: Soft. Bowel sounds are normal. He exhibits no distension. There is no tenderness.  Musculoskeletal: Normal range of motion. He exhibits no edema.  There is tenderness to palpation in the soft tissues of the lumbar region.  Neurological: He is alert and oriented to person, place, and time. Coordination normal.  DTRs are trace and symmetrical in the patellar and Achilles tendons bilaterally. Strength is 5 out of 5 in both lower extremities. He is ambulatory without difficulty.  Skin: Skin is warm and dry. He is not diaphoretic.  Nursing note and vitals reviewed.    ED Treatments / Results  Labs (all labs ordered are listed, but only abnormal results are displayed) Labs Reviewed  COMPREHENSIVE METABOLIC PANEL  CBC WITH DIFFERENTIAL/PLATELET  BRAIN NATRIURETIC PEPTIDE    EKG  EKG Interpretation  Date/Time:  Friday March 06 2017 07:37:34 EDT Ventricular Rate:  72 PR Interval:    QRS Duration: 101 QT Interval:  425 QTC Calculation: 466 R Axis:   -38 Text Interpretation:  Sinus rhythm Prolonged PR interval Left axis deviation Confirmed by Geoffery LyonseLo, Abiha Lukehart ([id number]) on 03/06/2017 8:06:26 AM  Also confirmed by Geoffery LyonseLo, Elicia Lui ([id number]), editor Madalyn RobEverhart, Marilyn [phone])  on 03/06/2017 8:32:00 AM       Radiology No results found.  Procedures Procedures (including critical care time)  Medications Ordered in ED Medications  ipratropium-albuterol (DUONEB) 0.5-2.5 (3) MG/3ML nebulizer solution 3 mL (not administered)     Initial Impression / Assessment and Plan / ED Course  I have reviewed the triage vital signs and the nursing notes.  Pertinent labs & imaging results that were available during my care of the patient were reviewed by me and considered in my medical decision making (see chart for details).  Patient presenting with complaints of low back discomfort with a radicular component, however no neurologic deficits or bowel or bladder complaints. I see no indication for imaging. He will be treated with prednisone, tramadol, and follow-up if not improving.  He also has cough and shortness of breath worsening over the past several weeks. He will be treated with Zithromax for presumed bronchitis. The steroid being prescribed for his back may also help with this as well. Severe given an albuterol MDI which he can use as needed.  The patient does have a history of DVT, however given his presentation, I highly doubt pulmonary embolism. There is no hypoxia, no tachycardia, and his symptoms are not typical of PE. I do not feel as though any further workup into this is indicated.  Final Clinical Impressions(s) / ED Diagnoses   Final diagnoses:  None    New Prescriptions New Prescriptions   No medications on file     Geoffery Lyons, MD 03/06/17 [phone]

## 2017-03-06 NOTE — Discharge Instructions (Signed)
Zithromax and prednisone as prescribed.  Hydrocodone as prescribed as needed for pain.  Follow-up with your primary Dr. if symptoms are not improving in the next week, and return to the emergency department if symptoms significantly worsen or change in the meantime.

## 2017-03-06 NOTE — ED Notes (Signed)
ED Provider at bedside. 

## 2017-03-06 NOTE — ED Triage Notes (Signed)
Pt c/o lower back pain that radiates to his legs with pain & tingling, going on 2-3 months. Made worse with extended periods of standing. Also c/o SOB. Pt states he always has SOB but it has grown worse over the past couple months, particularly with exertion.

## 2017-03-31 DIAGNOSIS — M5416 Radiculopathy, lumbar region: Secondary | ICD-10-CM | POA: Diagnosis not present

## 2017-03-31 DIAGNOSIS — I10 Essential (primary) hypertension: Secondary | ICD-10-CM | POA: Diagnosis not present

## 2017-10-15 DIAGNOSIS — B9620 Unspecified Escherichia coli [E. coli] as the cause of diseases classified elsewhere: Secondary | ICD-10-CM | POA: Diagnosis not present

## 2017-10-15 DIAGNOSIS — N289 Disorder of kidney and ureter, unspecified: Secondary | ICD-10-CM | POA: Diagnosis not present

## 2017-10-15 DIAGNOSIS — Z0283 Encounter for blood-alcohol and blood-drug test: Secondary | ICD-10-CM | POA: Diagnosis not present

## 2017-10-15 DIAGNOSIS — N390 Urinary tract infection, site not specified: Secondary | ICD-10-CM | POA: Diagnosis not present

## 2018-07-04 ENCOUNTER — Ambulatory Visit (HOSPITAL_COMMUNITY)
Admission: EM | Admit: 2018-07-04 | Discharge: 2018-07-04 | Disposition: A | Discharge status: Home / Self Care | Payer: Medicare Other | Attending: Internal Medicine | Admitting: Internal Medicine

## 2018-07-04 ENCOUNTER — Telehealth (HOSPITAL_COMMUNITY): Payer: Self-pay | Admitting: *Deleted

## 2018-07-04 ENCOUNTER — Other Ambulatory Visit: Payer: Self-pay

## 2018-07-04 ENCOUNTER — Encounter (HOSPITAL_COMMUNITY): Payer: Self-pay | Admitting: Emergency Medicine

## 2018-07-04 DIAGNOSIS — M25561 Pain in right knee: Secondary | ICD-10-CM

## 2018-07-04 MED ORDER — MELOXICAM 15 MG PO TABS: 15.0000 mg | ORAL_TABLET | Freq: Every day | ORAL | 0 refills | Status: DC

## 2018-07-04 MED ORDER — MELOXICAM 7.5 MG PO TABS: 7.5000 mg | ORAL_TABLET | Freq: Every day | ORAL | 0 refills | Status: AC

## 2018-07-04 MED ORDER — MELOXICAM 7.5 MG PO TABS: 7.5000 mg | ORAL_TABLET | Freq: Every day | ORAL | 0 refills | Status: DC

## 2018-07-04 NOTE — ED Provider Notes (Signed)
[facility]    CSN: [id number] Arrival date & time: 07/04/18  1326     History   Chief Complaint Chief Complaint  Patient presents with  . Knee Pain    HPI Malik Mullins is a 62 y.o. male.   [name] presents with complaints of right knee pain which started 3-4 days ago. States has had similar in the past but had improved. Started working a new part time job and is on his feet a lot, feels this may have attributed to his symptoms. No specific injury. Has been told he has arthritis in the past. Unsure if has ever seen an orthopedist. Was more swollen than today, has improved some. Improves with rest and elevation but doesn't resolve. Worse with activity and walking. No numbness or tingling. Pain does radiate down leg, no foot pain. Pain 8/10. Took advil once a few days ago which did not help. Otherwise hasn't taken any medications for pain. Doesn't take any medications regularly. Hx of htn, knee pain, DVT, OA, av block. Denies known history of CKD but is listed in hx.     ROS per HPI.      Past Medical History:  Diagnosis Date  . Back pain   . DVT (deep venous thrombosis) (HCC)    left leg and right leg  . Hypertension   . Knee joint pain   . Pneumonia     Patient Active Problem List   Diagnosis Date Noted  . Sleep apnea 11/13/2015  . Alcoholic fatty liver 11/13/2015  . Chronic kidney disease (CKD) stage G3a/A1, moderately decreased glomerular filtration rate (GFR) between 45-59 mL/min/1.73 square meter and albuminuria creatinine ratio less than 30 mg/g (HCC) 11/13/2015  . Abdominal distension   . Arm pain   . Mobitz (type) I (Wenckebach's) atrioventricular block   . Enterococcal bacteremia   . Candida UTI   . Acute cystitis without hematuria   . Poor dentition 11/07/2015  . Sepsis (HCC) 11/06/2015  . Bilateral pneumonia 11/06/2015  . Urinary retention   . Transient complete heart block vs blocked PACs 03/04/2013    Class: Acute  . Syncope and  collapse 03/03/2013  . CAP (community acquired pneumonia) 10/28/2012  . Hypertension 10/27/2012  . Osteoarthritis of both knees 10/27/2012  . History of DVT (deep vein thrombosis)-? 2012 10/27/2012    Past Surgical History:  Procedure Laterality Date  . MANDIBLE SURGERY    . ORIF ELBOW FRACTURE Left 09/26/2015   Procedure: OPEN REDUCTION INTERNAL FIXATION (ORIF) LEFT OLECRANON FRACTURE;  Surgeon: Tarry Kos, MD;  Location: [facility];  Service: Orthopedics;  Laterality: Left;  . TEE WITHOUT CARDIOVERSION N/A 11/13/2015   Procedure: TRANSESOPHAGEAL ECHOCARDIOGRAM (TEE);  Surgeon: Wendall Stade, MD;  Location: [facility] ENDOSCOPY;  Service: Cardiovascular;  Laterality: N/A;       Home Medications    Prior to Admission medications   Medication Sig Start Date End Date Taking? Authorizing Provider  meloxicam (MOBIC) 15 MG tablet Take 1 tablet (15 mg total) by mouth daily. 07/04/18   Georgetta Haber, NP    Family History Family History  Problem Relation Age of Onset  . Hypertension Mother   . Diabetes Father     Social History Social History   Tobacco Use  . Smoking status: Current Some Day Smoker    Packs/day: 0.30    Years: 40.00    Pack years: 12.00    Types: Cigarettes  . Smokeless tobacco: Never Used  Substance Use  Topics  . Alcohol use: Yes    Comment: "from time to time"  . Drug use: No     Allergies   Lisinopril and Tramadol   Review of Systems Review of Systems   Physical Exam Triage Vital Signs ED Triage Vitals  Enc Vitals Group     BP 07/04/18 1407 123/85     Pulse Rate 07/04/18 1407 77     Resp 07/04/18 1407 20     Temp 07/04/18 1407 97.9 F (36.6 C)     Temp Source 07/04/18 1407 Oral     SpO2 07/04/18 1407 98 %     Weight --      Height --      Head Circumference --      Peak Flow --      Pain Score 07/04/18 1404 8     Pain Loc --      Pain Edu? --      Excl. in GC? --    No data found.  Updated Vital Signs BP 123/85  (BP Location: Right Arm)   Pulse 77   Temp 97.9 F (36.6 C) (Oral)   Resp 20   SpO2 98%   Physical Exam  Constitutional: He is oriented to person, place, and time. He appears well-developed and well-nourished.  Cardiovascular: Normal rate and regular rhythm.  Pulmonary/Chest: Effort normal and breath sounds normal.  Musculoskeletal:       Right knee: He exhibits decreased range of motion and swelling. He exhibits no effusion, no ecchymosis, no deformity, no laceration, no erythema, normal alignment, no LCL laxity, normal patellar mobility, no bony tenderness, normal meniscus and no MCL laxity. Tenderness found. Medial joint line and MCL tenderness noted.  Pain with flexion and extension; pain with medial knee stress but no laxity noted; edema noted without obvious effusion; no redness or warmth; calf soft and non tender; limp with ambulation noted   Neurological: He is alert and oriented to person, place, and time.  Skin: Skin is warm and dry.     UC Treatments / Results  Labs (all labs ordered are listed, but only abnormal results are displayed) Labs Reviewed - No data to display  EKG None  Radiology No results found.  Procedures Procedures (including critical care time)  Medications Ordered in UC Medications - No data to display  Initial Impression / Assessment and Plan / UC Course  I have reviewed the triage vital signs and the nursing notes.  Pertinent labs & imaging results that were available during my care of the patient were reviewed by me and considered in my medical decision making (see chart for details).     Findings are not consistent with dvt or septic joint at this time. Medial right knee with pain, worse with activity, triggered by increased on foot activity at new job. Hx of OA. 10 days of meloxicam provided as well as knee sleeve. Encouraged follow up with orthopedics for recheck. Ice, elevation.  Patient verbalized understanding and agreeable to plan.     Final Clinical Impressions(s) / UC Diagnoses   Final diagnoses:  Acute pain of right knee     Discharge Instructions     Use of the provided sleeve while active.  At end of day ice and elevate the knee.  Use of meloxicam daily, take with food, drink plenty of water.  See exercises provided.  Ensure your shoes you work in have plenty of heel support.  Please follow up with orthopedics for any  persistent or worsening of symptoms.    ED Prescriptions    Medication Sig Dispense Auth. Provider   meloxicam (MOBIC) 15 MG tablet Take 1 tablet (15 mg total) by mouth daily. 20 tablet Georgetta Haber, NP     Controlled Substance Prescriptions [place] Controlled Substance Registry consulted? Not Applicable   Georgetta Haber, NP 07/04/18 1448

## 2018-07-04 NOTE — Discharge Instructions (Signed)
Use of the provided sleeve while active.  At end of day ice and elevate the knee.  Use of meloxicam daily, take with food, drink plenty of water.  See exercises provided.  Ensure your shoes you work in have plenty of heel support.  Please follow up with orthopedics for any persistent or worsening of symptoms.

## 2018-07-04 NOTE — ED Triage Notes (Signed)
The patient presented to the [facility] with a complaint of right knee pain x 4 days. The patient denied any known injury.

## 2018-07-12 ENCOUNTER — Ambulatory Visit (INDEPENDENT_AMBULATORY_CARE_PROVIDER_SITE_OTHER): Payer: Medicare Other | Admitting: Physician Assistant

## 2018-07-16 ENCOUNTER — Ambulatory Visit (INDEPENDENT_AMBULATORY_CARE_PROVIDER_SITE_OTHER): Payer: Medicare PPO

## 2018-07-16 ENCOUNTER — Ambulatory Visit (INDEPENDENT_AMBULATORY_CARE_PROVIDER_SITE_OTHER): Payer: Medicare PPO | Admitting: Physician Assistant

## 2018-07-16 ENCOUNTER — Encounter (INDEPENDENT_AMBULATORY_CARE_PROVIDER_SITE_OTHER): Payer: Self-pay | Admitting: Physician Assistant

## 2018-07-16 DIAGNOSIS — M1711 Unilateral primary osteoarthritis, right knee: Secondary | ICD-10-CM | POA: Diagnosis not present

## 2018-07-16 MED ORDER — LIDOCAINE HCL 1 % IJ SOLN
2.0000 mL | INTRAMUSCULAR | Status: AC | PRN
  Administered 2018-07-16: 2 mL

## 2018-07-16 MED ORDER — METHYLPREDNISOLONE ACETATE 40 MG/ML IJ SUSP
40.0000 mg | INTRAMUSCULAR | Status: AC | PRN
Start: 2018-07-16 — End: 2018-07-16
  Administered 2018-07-16: 40 mg via INTRA_ARTICULAR

## 2018-07-16 MED ORDER — BUPIVACAINE HCL 0.25 % IJ SOLN
2.0000 mL | INTRAMUSCULAR | Status: AC | PRN
  Administered 2018-07-16: 2 mL via INTRA_ARTICULAR

## 2018-07-16 NOTE — Addendum Note (Signed)
Addended by: Albertina Parr on: 07/16/2018 01:52 PM   Modules accepted: Orders

## 2018-07-16 NOTE — Progress Notes (Signed)
Office Visit Note   Patient: Malik Mullins           Date of Birth: [birth year]           MRN: [id number] Visit Date: 07/16/2018              Requested by: No referring provider defined for this encounter. PCP: Patient, No Pcp Per   Assessment & Plan: Visit Diagnoses:  1. Primary osteoarthritis of right knee     Plan: Impression is primary localized osteoarthritis right knee.  Because the patient has never had a cortisone injection I feel it is appropriate to proceed with one today in hopes of relieving his pain.  We will also go ahead and obtain a venous Doppler ultrasound right lower extremity based on his symptoms at today's visit as well as his previous history.  He has been instructed to seek immediate medical attention should he develop any chest pain or shortness of breath.  Call with concerning symptoms in the meantime.  Follow-Up Instructions: Return if symptoms worsen or fail to improve.   Orders:  Orders Placed This Encounter  Procedures  . Large Joint Inj: R knee  . XR KNEE 3 VIEW RIGHT   No orders of the defined types were placed in this encounter.     Procedures: Large Joint Inj: R knee on 07/16/2018 1:46 PM Indications: pain Details: 22 G needle, anterolateral approach Medications: 2 mL lidocaine 1 %; 2 mL bupivacaine 0.25 %; 40 mg methylPREDNISolone acetate 40 MG/ML      Clinical Data: No additional findings.   Subjective: Chief Complaint  Patient presents with  . Right Knee - Pain    HPI patient is a pleasant 62 year old gentleman who presents to our clinic today with right knee pain.  This is been ongoing for the past several years without any known injury or change in activity.  His pain is primarily to the medial aspect.  He does have some radiation to the medial calf.  He does note occasional mechanical symptoms but no instability.  Pain seems to be worse with standing for long periods of time.  He has been taking Aleve with mild relief of  symptoms.  No previous cortisone injection or surgical intervention to the right knee.  He does mention a previous history of DVT bilateral lower extremities but states he is no longer on anticoagulation for this.  He denies any chest pain or shortness of breath.  No fevers or chills or cough.  Review of Systems as detailed in HPI.  All others reviewed and are negative.   Objective: Vital Signs: There were no vitals taken for this visit.  Physical Exam well-developed well-nourished gentleman no acute distress.  Alert and oriented x3.  Ortho Exam examination of the right knee shows no effusion.  Range of motion 0 to 100 degrees.  Marked tenderness medial joint line.  Minimal patellar crepitus.  He does have moderate tenderness to the medial calf.  Negative Homans.  No erythema.  He is neurovascularly intact distally.  Specialty Comments:  No specialty comments available.  Imaging: Xr Knee 3 View Right  Result Date: 07/16/2018 X-rays demonstrate moderate tract departmental degenerative changes    PMFS History: Patient Active Problem List   Diagnosis Date Noted  . Sleep apnea 11/13/2015  . Alcoholic fatty liver 11/13/2015  . Chronic kidney disease (CKD) stage G3a/A1, moderately decreased glomerular filtration rate (GFR) between 45-59 mL/min/1.73 square meter and albuminuria creatinine ratio less than 30 mg/g (  HCC) 11/13/2015  . Abdominal distension   . Arm pain   . Mobitz (type) I (Wenckebach's) atrioventricular block   . Enterococcal bacteremia   . Candida UTI   . Acute cystitis without hematuria   . Poor dentition 11/07/2015  . Sepsis (HCC) 11/06/2015  . Bilateral pneumonia 11/06/2015  . Urinary retention   . Transient complete heart block vs blocked PACs 03/04/2013    Class: Acute  . Syncope and collapse 03/03/2013  . CAP (community acquired pneumonia) 10/28/2012  . Hypertension 10/27/2012  . Primary osteoarthritis of right knee 10/27/2012  . History of DVT (deep vein  thrombosis)-? 2012 10/27/2012   Past Medical History:  Diagnosis Date  . Back pain   . DVT (deep venous thrombosis) (HCC)    left leg and right leg  . Hypertension   . Knee joint pain   . Pneumonia     Family History  Problem Relation Age of Onset  . Hypertension Mother   . Diabetes Father     Past Surgical History:  Procedure Laterality Date  . MANDIBLE SURGERY    . ORIF ELBOW FRACTURE Left 09/26/2015   Procedure: OPEN REDUCTION INTERNAL FIXATION (ORIF) LEFT OLECRANON FRACTURE;  Surgeon: Tarry Kos, MD;  Location: [facility];  Service: Orthopedics;  Laterality: Left;  . TEE WITHOUT CARDIOVERSION N/A 11/13/2015   Procedure: TRANSESOPHAGEAL ECHOCARDIOGRAM (TEE);  Surgeon: Wendall Stade, MD;  Location[facility] ENDOSCOPY;  Service: Cardiovascular;  Laterality: N/A;   Social History   Occupational History  . Not on file  Tobacco Use  . Smoking status: Current Some Day Smoker    Packs/day: 0.30    Years: 40.00    Pack years: 12.00    Types: Cigarettes  . Smokeless tobacco: Never Used  Substance and Sexual Activity  . Alcohol use: Yes    Comment: "from time to time"  . Drug use: No  . Sexual activity: Never

## 2018-07-26 ENCOUNTER — Ambulatory Visit (HOSPITAL_COMMUNITY)
Admission: RE | Admit: 2018-07-26 | Discharge: 2018-07-26 | Disposition: A | Discharge status: Home / Self Care | Payer: Medicare PPO | Source: Ambulatory Visit | Attending: Physician Assistant | Admitting: Physician Assistant

## 2018-07-26 DIAGNOSIS — M1711 Unilateral primary osteoarthritis, right knee: Secondary | ICD-10-CM | POA: Diagnosis present

## 2018-07-26 NOTE — Progress Notes (Signed)
Preliminary results by tech - Right Lower Ext. Venous Duplex Completed. Negative for deep vein thrombosis. A small fluid collection was noted on the medial aspect of the rght knee. Malik Mullins, BS, RDMS, RVT
